# Patient Record
Sex: Female | Born: 1940
Health system: Southern US, Community
[De-identification: ages and names within clinical notes are randomized; demographics above are authoritative.]

## PROBLEM LIST (undated history)

## (undated) DIAGNOSIS — I10 Essential (primary) hypertension: Secondary | ICD-10-CM

## (undated) DIAGNOSIS — E785 Hyperlipidemia, unspecified: Secondary | ICD-10-CM

## (undated) DIAGNOSIS — M8580 Other specified disorders of bone density and structure, unspecified site: Secondary | ICD-10-CM

## (undated) DIAGNOSIS — E119 Type 2 diabetes mellitus without complications: Secondary | ICD-10-CM

## (undated) HISTORY — DX: Other specified disorders of bone density and structure, unspecified site: M85.80

## (undated) HISTORY — DX: Hyperlipidemia, unspecified: E78.5

## (undated) HISTORY — DX: Type 2 diabetes mellitus without complications: E11.9

## (undated) HISTORY — DX: Essential (primary) hypertension: I10

---

## 1998-08-21 ENCOUNTER — Emergency Department (HOSPITAL_COMMUNITY): Admission: EM | Admit: 1998-08-21 | Discharge: 1998-08-21 | Payer: Self-pay | Admitting: Internal Medicine

## 1999-06-15 ENCOUNTER — Encounter: Admission: RE | Admit: 1999-06-15 | Discharge: 1999-06-15 | Payer: Self-pay | Admitting: Family Medicine

## 1999-06-15 ENCOUNTER — Encounter: Payer: Self-pay | Admitting: Family Medicine

## 2000-06-17 ENCOUNTER — Encounter: Admission: RE | Admit: 2000-06-17 | Discharge: 2000-06-17 | Payer: Self-pay | Admitting: Family Medicine

## 2000-06-17 ENCOUNTER — Encounter: Payer: Self-pay | Admitting: Family Medicine

## 2001-06-18 ENCOUNTER — Encounter: Payer: Self-pay | Admitting: Family Medicine

## 2001-06-18 ENCOUNTER — Encounter: Admission: RE | Admit: 2001-06-18 | Discharge: 2001-06-18 | Payer: Self-pay | Admitting: Family Medicine

## 2001-10-15 ENCOUNTER — Ambulatory Visit (HOSPITAL_COMMUNITY): Admission: RE | Admit: 2001-10-15 | Discharge: 2001-10-15 | Payer: Self-pay | Admitting: Gastroenterology

## 2002-06-22 ENCOUNTER — Encounter: Payer: Self-pay | Admitting: Family Medicine

## 2002-06-22 ENCOUNTER — Encounter: Admission: RE | Admit: 2002-06-22 | Discharge: 2002-06-22 | Payer: Self-pay | Admitting: Family Medicine

## 2002-08-04 ENCOUNTER — Encounter: Admission: RE | Admit: 2002-08-04 | Discharge: 2002-11-02 | Payer: Self-pay | Admitting: Family Medicine

## 2003-06-23 ENCOUNTER — Encounter: Admission: RE | Admit: 2003-06-23 | Discharge: 2003-06-23 | Payer: Self-pay | Admitting: Family Medicine

## 2003-07-18 ENCOUNTER — Other Ambulatory Visit: Admission: RE | Admit: 2003-07-18 | Discharge: 2003-07-18 | Payer: Self-pay | Admitting: *Deleted

## 2004-07-02 ENCOUNTER — Encounter: Admission: RE | Admit: 2004-07-02 | Discharge: 2004-07-02 | Payer: Self-pay | Admitting: Family Medicine

## 2005-07-04 ENCOUNTER — Encounter: Admission: RE | Admit: 2005-07-04 | Discharge: 2005-07-04 | Payer: Self-pay | Admitting: Family Medicine

## 2005-07-10 ENCOUNTER — Encounter: Admission: RE | Admit: 2005-07-10 | Discharge: 2005-07-10 | Payer: Self-pay | Admitting: Family Medicine

## 2006-07-17 ENCOUNTER — Encounter: Admission: RE | Admit: 2006-07-17 | Discharge: 2006-07-17 | Payer: Self-pay | Admitting: Family Medicine

## 2007-07-20 ENCOUNTER — Encounter: Admission: RE | Admit: 2007-07-20 | Discharge: 2007-07-20 | Payer: Self-pay | Admitting: Family Medicine

## 2008-02-05 ENCOUNTER — Emergency Department (HOSPITAL_COMMUNITY): Admission: EM | Admit: 2008-02-05 | Discharge: 2008-02-06 | Payer: Self-pay | Admitting: Emergency Medicine

## 2008-03-28 ENCOUNTER — Encounter: Admission: RE | Admit: 2008-03-28 | Discharge: 2008-04-14 | Payer: Self-pay | Admitting: Family Medicine

## 2008-04-18 ENCOUNTER — Encounter: Admission: RE | Admit: 2008-04-18 | Discharge: 2008-05-10 | Payer: Self-pay | Admitting: Family Medicine

## 2008-07-21 ENCOUNTER — Encounter: Admission: RE | Admit: 2008-07-21 | Discharge: 2008-07-21 | Payer: Self-pay | Admitting: Family Medicine

## 2009-07-24 ENCOUNTER — Encounter: Admission: RE | Admit: 2009-07-24 | Discharge: 2009-07-24 | Payer: Self-pay | Admitting: Family Medicine

## 2010-05-06 ENCOUNTER — Encounter: Payer: Self-pay | Admitting: Family Medicine

## 2010-06-18 ENCOUNTER — Other Ambulatory Visit: Payer: Self-pay | Admitting: Family Medicine

## 2010-06-18 DIAGNOSIS — Z1231 Encounter for screening mammogram for malignant neoplasm of breast: Secondary | ICD-10-CM

## 2010-07-26 ENCOUNTER — Ambulatory Visit
Admission: RE | Admit: 2010-07-26 | Discharge: 2010-07-26 | Disposition: A | Discharge status: Home / Self Care | Payer: Medicare Other | Source: Ambulatory Visit | Attending: Family Medicine | Admitting: Family Medicine

## 2010-07-26 DIAGNOSIS — Z1231 Encounter for screening mammogram for malignant neoplasm of breast: Secondary | ICD-10-CM

## 2011-05-14 DIAGNOSIS — E782 Mixed hyperlipidemia: Secondary | ICD-10-CM | POA: Diagnosis not present

## 2011-05-14 DIAGNOSIS — IMO0001 Reserved for inherently not codable concepts without codable children: Secondary | ICD-10-CM | POA: Diagnosis not present

## 2011-05-14 DIAGNOSIS — M899 Disorder of bone, unspecified: Secondary | ICD-10-CM | POA: Diagnosis not present

## 2011-05-14 DIAGNOSIS — I10 Essential (primary) hypertension: Secondary | ICD-10-CM | POA: Diagnosis not present

## 2011-05-28 DIAGNOSIS — J029 Acute pharyngitis, unspecified: Secondary | ICD-10-CM | POA: Diagnosis not present

## 2011-06-21 ENCOUNTER — Other Ambulatory Visit: Payer: Self-pay | Admitting: Family Medicine

## 2011-06-21 DIAGNOSIS — Z1231 Encounter for screening mammogram for malignant neoplasm of breast: Secondary | ICD-10-CM

## 2011-07-29 ENCOUNTER — Ambulatory Visit
Admission: RE | Admit: 2011-07-29 | Discharge: 2011-07-29 | Disposition: A | Discharge status: Home / Self Care | Payer: Medicare Other | Source: Ambulatory Visit | Attending: Family Medicine | Admitting: Family Medicine

## 2011-07-29 DIAGNOSIS — Z1231 Encounter for screening mammogram for malignant neoplasm of breast: Secondary | ICD-10-CM | POA: Diagnosis not present

## 2011-08-01 DIAGNOSIS — E119 Type 2 diabetes mellitus without complications: Secondary | ICD-10-CM | POA: Diagnosis not present

## 2011-08-01 DIAGNOSIS — H26019 Infantile and juvenile cortical, lamellar, or zonular cataract, unspecified eye: Secondary | ICD-10-CM | POA: Diagnosis not present

## 2011-08-01 DIAGNOSIS — H2510 Age-related nuclear cataract, unspecified eye: Secondary | ICD-10-CM | POA: Diagnosis not present

## 2011-08-07 DIAGNOSIS — M899 Disorder of bone, unspecified: Secondary | ICD-10-CM | POA: Diagnosis not present

## 2011-08-07 DIAGNOSIS — M949 Disorder of cartilage, unspecified: Secondary | ICD-10-CM | POA: Diagnosis not present

## 2011-08-07 DIAGNOSIS — Z0000 Encounter for general adult medical examination without abnormal findings: Secondary | ICD-10-CM | POA: Diagnosis not present

## 2011-08-07 DIAGNOSIS — M839 Adult osteomalacia, unspecified: Secondary | ICD-10-CM | POA: Diagnosis not present

## 2011-08-07 DIAGNOSIS — E782 Mixed hyperlipidemia: Secondary | ICD-10-CM | POA: Diagnosis not present

## 2011-08-07 DIAGNOSIS — I10 Essential (primary) hypertension: Secondary | ICD-10-CM | POA: Diagnosis not present

## 2011-08-07 DIAGNOSIS — IMO0001 Reserved for inherently not codable concepts without codable children: Secondary | ICD-10-CM | POA: Diagnosis not present

## 2011-08-07 DIAGNOSIS — J029 Acute pharyngitis, unspecified: Secondary | ICD-10-CM | POA: Diagnosis not present

## 2011-08-13 DIAGNOSIS — B353 Tinea pedis: Secondary | ICD-10-CM | POA: Diagnosis not present

## 2011-08-13 DIAGNOSIS — H6120 Impacted cerumen, unspecified ear: Secondary | ICD-10-CM | POA: Diagnosis not present

## 2011-08-13 DIAGNOSIS — E782 Mixed hyperlipidemia: Secondary | ICD-10-CM | POA: Diagnosis not present

## 2011-08-13 DIAGNOSIS — F411 Generalized anxiety disorder: Secondary | ICD-10-CM | POA: Diagnosis not present

## 2011-08-13 DIAGNOSIS — E559 Vitamin D deficiency, unspecified: Secondary | ICD-10-CM | POA: Diagnosis not present

## 2011-08-13 DIAGNOSIS — E119 Type 2 diabetes mellitus without complications: Secondary | ICD-10-CM | POA: Diagnosis not present

## 2011-08-13 DIAGNOSIS — I10 Essential (primary) hypertension: Secondary | ICD-10-CM | POA: Diagnosis not present

## 2011-08-27 DIAGNOSIS — T148 Other injury of unspecified body region: Secondary | ICD-10-CM | POA: Diagnosis not present

## 2011-08-27 DIAGNOSIS — T07XXXA Unspecified multiple injuries, initial encounter: Secondary | ICD-10-CM | POA: Diagnosis not present

## 2011-12-10 DIAGNOSIS — M545 Low back pain, unspecified: Secondary | ICD-10-CM | POA: Diagnosis not present

## 2012-02-10 DIAGNOSIS — E119 Type 2 diabetes mellitus without complications: Secondary | ICD-10-CM | POA: Diagnosis not present

## 2012-02-10 DIAGNOSIS — E559 Vitamin D deficiency, unspecified: Secondary | ICD-10-CM | POA: Diagnosis not present

## 2012-02-10 DIAGNOSIS — M839 Adult osteomalacia, unspecified: Secondary | ICD-10-CM | POA: Diagnosis not present

## 2012-02-10 DIAGNOSIS — I10 Essential (primary) hypertension: Secondary | ICD-10-CM | POA: Diagnosis not present

## 2012-02-10 DIAGNOSIS — R609 Edema, unspecified: Secondary | ICD-10-CM | POA: Diagnosis not present

## 2012-02-10 DIAGNOSIS — M899 Disorder of bone, unspecified: Secondary | ICD-10-CM | POA: Diagnosis not present

## 2012-02-10 DIAGNOSIS — E782 Mixed hyperlipidemia: Secondary | ICD-10-CM | POA: Diagnosis not present

## 2012-02-10 DIAGNOSIS — M545 Low back pain, unspecified: Secondary | ICD-10-CM | POA: Diagnosis not present

## 2012-02-10 DIAGNOSIS — M949 Disorder of cartilage, unspecified: Secondary | ICD-10-CM | POA: Diagnosis not present

## 2012-02-12 DIAGNOSIS — Z23 Encounter for immunization: Secondary | ICD-10-CM | POA: Diagnosis not present

## 2012-02-12 DIAGNOSIS — M545 Low back pain, unspecified: Secondary | ICD-10-CM | POA: Diagnosis not present

## 2012-02-12 DIAGNOSIS — R609 Edema, unspecified: Secondary | ICD-10-CM | POA: Diagnosis not present

## 2012-02-12 DIAGNOSIS — J309 Allergic rhinitis, unspecified: Secondary | ICD-10-CM | POA: Diagnosis not present

## 2012-02-12 DIAGNOSIS — I10 Essential (primary) hypertension: Secondary | ICD-10-CM | POA: Diagnosis not present

## 2012-02-12 DIAGNOSIS — IMO0001 Reserved for inherently not codable concepts without codable children: Secondary | ICD-10-CM | POA: Diagnosis not present

## 2012-02-12 DIAGNOSIS — F411 Generalized anxiety disorder: Secondary | ICD-10-CM | POA: Diagnosis not present

## 2012-02-12 DIAGNOSIS — E782 Mixed hyperlipidemia: Secondary | ICD-10-CM | POA: Diagnosis not present

## 2012-02-12 DIAGNOSIS — G479 Sleep disorder, unspecified: Secondary | ICD-10-CM | POA: Diagnosis not present

## 2012-06-30 ENCOUNTER — Other Ambulatory Visit: Payer: Self-pay

## 2012-06-30 DIAGNOSIS — Z1231 Encounter for screening mammogram for malignant neoplasm of breast: Secondary | ICD-10-CM

## 2012-07-16 DIAGNOSIS — H2510 Age-related nuclear cataract, unspecified eye: Secondary | ICD-10-CM | POA: Diagnosis not present

## 2012-07-16 DIAGNOSIS — E119 Type 2 diabetes mellitus without complications: Secondary | ICD-10-CM | POA: Diagnosis not present

## 2012-07-30 ENCOUNTER — Ambulatory Visit
Admission: RE | Admit: 2012-07-30 | Discharge: 2012-07-30 | Disposition: A | Discharge status: Home / Self Care | Payer: Medicare Other | Source: Ambulatory Visit

## 2012-07-30 DIAGNOSIS — Z1231 Encounter for screening mammogram for malignant neoplasm of breast: Secondary | ICD-10-CM | POA: Diagnosis not present

## 2012-08-10 DIAGNOSIS — M839 Adult osteomalacia, unspecified: Secondary | ICD-10-CM | POA: Diagnosis not present

## 2012-08-10 DIAGNOSIS — E559 Vitamin D deficiency, unspecified: Secondary | ICD-10-CM | POA: Diagnosis not present

## 2012-08-10 DIAGNOSIS — M545 Low back pain, unspecified: Secondary | ICD-10-CM | POA: Diagnosis not present

## 2012-08-10 DIAGNOSIS — E119 Type 2 diabetes mellitus without complications: Secondary | ICD-10-CM | POA: Diagnosis not present

## 2012-08-10 DIAGNOSIS — I10 Essential (primary) hypertension: Secondary | ICD-10-CM | POA: Diagnosis not present

## 2012-08-10 DIAGNOSIS — IMO0001 Reserved for inherently not codable concepts without codable children: Secondary | ICD-10-CM | POA: Diagnosis not present

## 2012-08-10 DIAGNOSIS — E782 Mixed hyperlipidemia: Secondary | ICD-10-CM | POA: Diagnosis not present

## 2012-08-10 DIAGNOSIS — F411 Generalized anxiety disorder: Secondary | ICD-10-CM | POA: Diagnosis not present

## 2012-08-13 DIAGNOSIS — E119 Type 2 diabetes mellitus without complications: Secondary | ICD-10-CM | POA: Diagnosis not present

## 2012-08-13 DIAGNOSIS — J309 Allergic rhinitis, unspecified: Secondary | ICD-10-CM | POA: Diagnosis not present

## 2012-08-13 DIAGNOSIS — M899 Disorder of bone, unspecified: Secondary | ICD-10-CM | POA: Diagnosis not present

## 2012-08-13 DIAGNOSIS — F411 Generalized anxiety disorder: Secondary | ICD-10-CM | POA: Diagnosis not present

## 2012-08-13 DIAGNOSIS — E782 Mixed hyperlipidemia: Secondary | ICD-10-CM | POA: Diagnosis not present

## 2012-08-13 DIAGNOSIS — I10 Essential (primary) hypertension: Secondary | ICD-10-CM | POA: Diagnosis not present

## 2012-11-29 DIAGNOSIS — R0989 Other specified symptoms and signs involving the circulatory and respiratory systems: Secondary | ICD-10-CM | POA: Diagnosis not present

## 2012-11-29 DIAGNOSIS — M79609 Pain in unspecified limb: Secondary | ICD-10-CM | POA: Diagnosis not present

## 2012-11-29 DIAGNOSIS — S8010XA Contusion of unspecified lower leg, initial encounter: Secondary | ICD-10-CM | POA: Diagnosis not present

## 2013-01-30 DIAGNOSIS — Z23 Encounter for immunization: Secondary | ICD-10-CM | POA: Diagnosis not present

## 2013-03-03 DIAGNOSIS — I10 Essential (primary) hypertension: Secondary | ICD-10-CM | POA: Diagnosis not present

## 2013-03-03 DIAGNOSIS — E119 Type 2 diabetes mellitus without complications: Secondary | ICD-10-CM | POA: Diagnosis not present

## 2013-03-03 DIAGNOSIS — F411 Generalized anxiety disorder: Secondary | ICD-10-CM | POA: Diagnosis not present

## 2013-03-03 DIAGNOSIS — J309 Allergic rhinitis, unspecified: Secondary | ICD-10-CM | POA: Diagnosis not present

## 2013-03-03 DIAGNOSIS — E782 Mixed hyperlipidemia: Secondary | ICD-10-CM | POA: Diagnosis not present

## 2013-03-03 DIAGNOSIS — M899 Disorder of bone, unspecified: Secondary | ICD-10-CM | POA: Diagnosis not present

## 2013-03-05 DIAGNOSIS — Z01419 Encounter for gynecological examination (general) (routine) without abnormal findings: Secondary | ICD-10-CM | POA: Diagnosis not present

## 2013-03-05 DIAGNOSIS — E119 Type 2 diabetes mellitus without complications: Secondary | ICD-10-CM | POA: Diagnosis not present

## 2013-03-05 DIAGNOSIS — Z1331 Encounter for screening for depression: Secondary | ICD-10-CM | POA: Diagnosis not present

## 2013-03-05 DIAGNOSIS — I10 Essential (primary) hypertension: Secondary | ICD-10-CM | POA: Diagnosis not present

## 2013-03-05 DIAGNOSIS — Z0000 Encounter for general adult medical examination without abnormal findings: Secondary | ICD-10-CM | POA: Diagnosis not present

## 2013-03-05 DIAGNOSIS — Z800 Family history of malignant neoplasm of digestive organs: Secondary | ICD-10-CM | POA: Diagnosis not present

## 2013-03-05 DIAGNOSIS — F411 Generalized anxiety disorder: Secondary | ICD-10-CM | POA: Diagnosis not present

## 2013-03-05 DIAGNOSIS — M899 Disorder of bone, unspecified: Secondary | ICD-10-CM | POA: Diagnosis not present

## 2013-03-05 DIAGNOSIS — E782 Mixed hyperlipidemia: Secondary | ICD-10-CM | POA: Diagnosis not present

## 2013-03-31 DIAGNOSIS — K5730 Diverticulosis of large intestine without perforation or abscess without bleeding: Secondary | ICD-10-CM | POA: Diagnosis not present

## 2013-03-31 DIAGNOSIS — Z800 Family history of malignant neoplasm of digestive organs: Secondary | ICD-10-CM | POA: Diagnosis not present

## 2013-04-19 DIAGNOSIS — Z800 Family history of malignant neoplasm of digestive organs: Secondary | ICD-10-CM | POA: Diagnosis not present

## 2013-04-19 DIAGNOSIS — K5730 Diverticulosis of large intestine without perforation or abscess without bleeding: Secondary | ICD-10-CM | POA: Diagnosis not present

## 2013-04-19 DIAGNOSIS — D126 Benign neoplasm of colon, unspecified: Secondary | ICD-10-CM | POA: Diagnosis not present

## 2013-04-19 DIAGNOSIS — Z1211 Encounter for screening for malignant neoplasm of colon: Secondary | ICD-10-CM | POA: Diagnosis not present

## 2013-07-20 ENCOUNTER — Other Ambulatory Visit: Payer: Self-pay

## 2013-07-20 DIAGNOSIS — Z1231 Encounter for screening mammogram for malignant neoplasm of breast: Secondary | ICD-10-CM

## 2013-08-05 DIAGNOSIS — H2510 Age-related nuclear cataract, unspecified eye: Secondary | ICD-10-CM | POA: Diagnosis not present

## 2013-08-05 DIAGNOSIS — E119 Type 2 diabetes mellitus without complications: Secondary | ICD-10-CM | POA: Diagnosis not present

## 2013-08-10 ENCOUNTER — Ambulatory Visit
Admission: RE | Admit: 2013-08-10 | Discharge: 2013-08-10 | Disposition: A | Discharge status: Home / Self Care | Payer: Medicare Other | Source: Ambulatory Visit

## 2013-08-10 ENCOUNTER — Encounter (INDEPENDENT_AMBULATORY_CARE_PROVIDER_SITE_OTHER): Payer: Self-pay

## 2013-08-10 DIAGNOSIS — Z1231 Encounter for screening mammogram for malignant neoplasm of breast: Secondary | ICD-10-CM

## 2013-08-30 DIAGNOSIS — M949 Disorder of cartilage, unspecified: Secondary | ICD-10-CM | POA: Diagnosis not present

## 2013-08-30 DIAGNOSIS — F411 Generalized anxiety disorder: Secondary | ICD-10-CM | POA: Diagnosis not present

## 2013-08-30 DIAGNOSIS — E119 Type 2 diabetes mellitus without complications: Secondary | ICD-10-CM | POA: Diagnosis not present

## 2013-08-30 DIAGNOSIS — J309 Allergic rhinitis, unspecified: Secondary | ICD-10-CM | POA: Diagnosis not present

## 2013-08-30 DIAGNOSIS — I10 Essential (primary) hypertension: Secondary | ICD-10-CM | POA: Diagnosis not present

## 2013-08-30 DIAGNOSIS — E782 Mixed hyperlipidemia: Secondary | ICD-10-CM | POA: Diagnosis not present

## 2013-08-30 DIAGNOSIS — M899 Disorder of bone, unspecified: Secondary | ICD-10-CM | POA: Diagnosis not present

## 2013-09-03 DIAGNOSIS — M949 Disorder of cartilage, unspecified: Secondary | ICD-10-CM | POA: Diagnosis not present

## 2013-09-03 DIAGNOSIS — F411 Generalized anxiety disorder: Secondary | ICD-10-CM | POA: Diagnosis not present

## 2013-09-03 DIAGNOSIS — E782 Mixed hyperlipidemia: Secondary | ICD-10-CM | POA: Diagnosis not present

## 2013-09-03 DIAGNOSIS — R197 Diarrhea, unspecified: Secondary | ICD-10-CM | POA: Diagnosis not present

## 2013-09-03 DIAGNOSIS — I10 Essential (primary) hypertension: Secondary | ICD-10-CM | POA: Diagnosis not present

## 2013-09-03 DIAGNOSIS — IMO0001 Reserved for inherently not codable concepts without codable children: Secondary | ICD-10-CM | POA: Diagnosis not present

## 2013-09-03 DIAGNOSIS — Z23 Encounter for immunization: Secondary | ICD-10-CM | POA: Diagnosis not present

## 2013-09-03 DIAGNOSIS — M899 Disorder of bone, unspecified: Secondary | ICD-10-CM | POA: Diagnosis not present

## 2013-10-25 DIAGNOSIS — M79609 Pain in unspecified limb: Secondary | ICD-10-CM | POA: Diagnosis not present

## 2013-10-25 DIAGNOSIS — J329 Chronic sinusitis, unspecified: Secondary | ICD-10-CM | POA: Diagnosis not present

## 2013-10-27 DIAGNOSIS — L739 Follicular disorder, unspecified: Secondary | ICD-10-CM | POA: Diagnosis not present

## 2013-10-27 DIAGNOSIS — L570 Actinic keratosis: Secondary | ICD-10-CM | POA: Diagnosis not present

## 2013-10-27 DIAGNOSIS — K2100 Gastro-esophageal reflux disease with esophagitis, without bleeding: Secondary | ICD-10-CM | POA: Diagnosis not present

## 2013-10-27 DIAGNOSIS — R0789 Other chest pain: Secondary | ICD-10-CM | POA: Diagnosis not present

## 2013-12-03 DIAGNOSIS — Z23 Encounter for immunization: Secondary | ICD-10-CM | POA: Diagnosis not present

## 2013-12-03 DIAGNOSIS — R197 Diarrhea, unspecified: Secondary | ICD-10-CM | POA: Diagnosis not present

## 2013-12-03 DIAGNOSIS — M949 Disorder of cartilage, unspecified: Secondary | ICD-10-CM | POA: Diagnosis not present

## 2013-12-03 DIAGNOSIS — I10 Essential (primary) hypertension: Secondary | ICD-10-CM | POA: Diagnosis not present

## 2013-12-03 DIAGNOSIS — IMO0001 Reserved for inherently not codable concepts without codable children: Secondary | ICD-10-CM | POA: Diagnosis not present

## 2013-12-03 DIAGNOSIS — F411 Generalized anxiety disorder: Secondary | ICD-10-CM | POA: Diagnosis not present

## 2013-12-03 DIAGNOSIS — M899 Disorder of bone, unspecified: Secondary | ICD-10-CM | POA: Diagnosis not present

## 2013-12-03 DIAGNOSIS — E782 Mixed hyperlipidemia: Secondary | ICD-10-CM | POA: Diagnosis not present

## 2014-01-26 ENCOUNTER — Encounter: Payer: Medicare Other | Attending: Family Medicine | Admitting: *Deleted

## 2014-01-26 VITALS — Ht 63.0 in | Wt 170.3 lb

## 2014-01-26 DIAGNOSIS — E118 Type 2 diabetes mellitus with unspecified complications: Secondary | ICD-10-CM | POA: Diagnosis not present

## 2014-01-26 DIAGNOSIS — Z23 Encounter for immunization: Secondary | ICD-10-CM | POA: Diagnosis not present

## 2014-01-26 DIAGNOSIS — Z713 Dietary counseling and surveillance: Secondary | ICD-10-CM | POA: Insufficient documentation

## 2014-01-26 DIAGNOSIS — E1165 Type 2 diabetes mellitus with hyperglycemia: Secondary | ICD-10-CM

## 2014-01-26 DIAGNOSIS — IMO0002 Reserved for concepts with insufficient information to code with codable children: Secondary | ICD-10-CM

## 2014-01-26 NOTE — Patient Instructions (Signed)
Plan:  Aim for 2 Carb Choices per meal (30 grams) +/- 1 either way  Aim for 0-1 Carbs per snack if hungry  Include protein in moderation with your meals and snacks Consider reading food labels for Total Carbohydrate of foods Continue with your activity level daily as tolerated Continue checking BG before breakfast every day and once a week check 2 hours after supper to evaluate your BG patterns

## 2014-01-30 ENCOUNTER — Encounter: Payer: Self-pay | Admitting: *Deleted

## 2014-01-30 NOTE — Progress Notes (Signed)
Diabetes Self-Management Education  Visit Type:  Initial  Appt. Start Time: 1030 Appt. End Time: 1200  01/30/2014  Allison Eaton, identified by name and date of birth, is a 73 y.o. female with a diagnosis of Diabetes: Type 2.  Other people present during visit:  Patient   ASSESSMENT  Height 5\' 3"  (1.6 m), weight 170 lb 4.8 oz (77.248 kg). Body mass index is 30.18 kg/(m^2).  Initial Visit Information:  Are you currently following a meal plan?: No   Are you taking your medications as prescribed?: Yes Are you checking your feet?: Yes How many days per week are you checking your feet?: 7 How often do you need to have someone help you when you read instructions, pamphlets, or other written materials from your doctor or pharmacy?: 1 - Never    Psychosocial:     Patient Belief/Attitude about Diabetes: Motivated to manage diabetes Self-care barriers: None Self-management support: Doctor's office;CDE visits Other persons present: Patient Patient Concerns: Nutrition/Meal planning Preferred Learning Style: Visual Learning Readiness: Contemplating  Complications:   Last HgB A1C per patient/outside source: 8.5 mg/dL How often do you check your blood sugar?: 1-2 times/day (FBG daily) Fasting Blood glucose range (mg/dL): 70-129 Number of hypoglycemic episodes per month: 1 Can you tell when your blood sugar is low?: Yes What do you do if your blood sugar is low?: candy or crackers Have you had a dilated eye exam in the past 12 months?: Yes Have you had a dental exam in the past 12 months?: Yes  Diet Intake:  Breakfast: toast, egg, sausage OR same meats in a biscuit, water Snack (morning): no Lunch: left overs Snack (afternoon): no Dinner: meat, starch, vegetables, salad, water Snack (evening): not usually, maybe chese crackers Beverage(s): water  Exercise:  Exercise: Light (walking / raking leaves) (walks to mail box 1 mile away or to son's house routinely) Light  Exercise amount of time (min / week): 120  Individualized Plan for Diabetes Self-Management Training:   Learning Objective:  Patient will have a greater understanding of diabetes self-management.  Patient education plan per assessed needs and concerns is to attend individual sessions for     Education Topics Reviewed with Patient Today:  Definition of diabetes, type 1 and 2, and the diagnosis of diabetes Role of diet in the treatment of diabetes and the relationship between the three main macronutrients and blood glucose level;Food label reading, portion sizes and measuring food.;Carbohydrate counting Role of exercise on diabetes management, blood pressure control and cardiac health. Reviewed patients medication for diabetes, action, purpose, timing of dose and side effects. Identified appropriate SMBG and/or A1C goals. Taught treatment of hypoglycemia - the 15 rule. Lipid levels, blood glucose control and heart disease Role of stress on diabetes      PATIENTS GOALS/Plan (Developed by the patient):  Nutrition: Follow meal plan discussed Physical Activity: Exercise 3-5 times per week Medications: take my medication as prescribed Monitoring : test blood glucose pre and post meals as discussed  Plan:   Patient Instructions  Plan:  Aim for 2 Carb Choices per meal (30 grams) +/- 1 either way  Aim for 0-1 Carbs per snack if hungry  Include protein in moderation with your meals and snacks Consider reading food labels for Total Carbohydrate of foods Continue with your activity level daily as tolerated Continue checking BG before breakfast every day and once a week check 2 hours after supper to evaluate your BG patterns       Expected Outcomes:  Demonstrated interest in learning. Expect positive outcomes  Education material provided: Living Well with Diabetes, A1C conversion sheet, Meal plan card and Carbohydrate counting sheet  If problems or questions, patient to contact team  via:  Phone and Email  Future DSME appointment: PRN

## 2014-03-07 DIAGNOSIS — E1165 Type 2 diabetes mellitus with hyperglycemia: Secondary | ICD-10-CM | POA: Diagnosis not present

## 2014-04-22 DIAGNOSIS — Z800 Family history of malignant neoplasm of digestive organs: Secondary | ICD-10-CM | POA: Diagnosis not present

## 2014-04-22 DIAGNOSIS — Z0000 Encounter for general adult medical examination without abnormal findings: Secondary | ICD-10-CM | POA: Diagnosis not present

## 2014-04-22 DIAGNOSIS — I10 Essential (primary) hypertension: Secondary | ICD-10-CM | POA: Diagnosis not present

## 2014-04-22 DIAGNOSIS — Z1389 Encounter for screening for other disorder: Secondary | ICD-10-CM | POA: Diagnosis not present

## 2014-04-22 DIAGNOSIS — E559 Vitamin D deficiency, unspecified: Secondary | ICD-10-CM | POA: Diagnosis not present

## 2014-04-22 DIAGNOSIS — M8580 Other specified disorders of bone density and structure, unspecified site: Secondary | ICD-10-CM | POA: Diagnosis not present

## 2014-04-22 DIAGNOSIS — M797 Fibromyalgia: Secondary | ICD-10-CM | POA: Diagnosis not present

## 2014-04-22 DIAGNOSIS — K5730 Diverticulosis of large intestine without perforation or abscess without bleeding: Secondary | ICD-10-CM | POA: Diagnosis not present

## 2014-04-22 DIAGNOSIS — F419 Anxiety disorder, unspecified: Secondary | ICD-10-CM | POA: Diagnosis not present

## 2014-04-22 DIAGNOSIS — E119 Type 2 diabetes mellitus without complications: Secondary | ICD-10-CM | POA: Diagnosis not present

## 2014-04-22 DIAGNOSIS — E782 Mixed hyperlipidemia: Secondary | ICD-10-CM | POA: Diagnosis not present

## 2014-05-18 DIAGNOSIS — M899 Disorder of bone, unspecified: Secondary | ICD-10-CM | POA: Diagnosis not present

## 2014-05-18 DIAGNOSIS — M8580 Other specified disorders of bone density and structure, unspecified site: Secondary | ICD-10-CM | POA: Diagnosis not present

## 2014-06-03 DIAGNOSIS — B349 Viral infection, unspecified: Secondary | ICD-10-CM | POA: Diagnosis not present

## 2014-06-03 DIAGNOSIS — R61 Generalized hyperhidrosis: Secondary | ICD-10-CM | POA: Diagnosis not present

## 2014-06-30 ENCOUNTER — Other Ambulatory Visit: Payer: Self-pay

## 2014-06-30 DIAGNOSIS — Z1231 Encounter for screening mammogram for malignant neoplasm of breast: Secondary | ICD-10-CM

## 2014-08-08 DIAGNOSIS — E119 Type 2 diabetes mellitus without complications: Secondary | ICD-10-CM | POA: Diagnosis not present

## 2014-08-08 DIAGNOSIS — H2513 Age-related nuclear cataract, bilateral: Secondary | ICD-10-CM | POA: Diagnosis not present

## 2014-08-12 ENCOUNTER — Ambulatory Visit
Admission: RE | Admit: 2014-08-12 | Discharge: 2014-08-12 | Disposition: A | Discharge status: Home / Self Care | Payer: Medicare Other | Source: Ambulatory Visit

## 2014-08-12 DIAGNOSIS — Z1231 Encounter for screening mammogram for malignant neoplasm of breast: Secondary | ICD-10-CM

## 2014-08-15 DIAGNOSIS — E782 Mixed hyperlipidemia: Secondary | ICD-10-CM | POA: Diagnosis not present

## 2014-08-15 DIAGNOSIS — E119 Type 2 diabetes mellitus without complications: Secondary | ICD-10-CM | POA: Diagnosis not present

## 2014-08-15 DIAGNOSIS — I10 Essential (primary) hypertension: Secondary | ICD-10-CM | POA: Diagnosis not present

## 2014-08-15 DIAGNOSIS — E559 Vitamin D deficiency, unspecified: Secondary | ICD-10-CM | POA: Diagnosis not present

## 2014-08-18 DIAGNOSIS — J309 Allergic rhinitis, unspecified: Secondary | ICD-10-CM | POA: Diagnosis not present

## 2014-08-18 DIAGNOSIS — E782 Mixed hyperlipidemia: Secondary | ICD-10-CM | POA: Diagnosis not present

## 2014-08-18 DIAGNOSIS — M8580 Other specified disorders of bone density and structure, unspecified site: Secondary | ICD-10-CM | POA: Diagnosis not present

## 2014-08-18 DIAGNOSIS — I10 Essential (primary) hypertension: Secondary | ICD-10-CM | POA: Diagnosis not present

## 2014-08-18 DIAGNOSIS — F419 Anxiety disorder, unspecified: Secondary | ICD-10-CM | POA: Diagnosis not present

## 2014-08-18 DIAGNOSIS — E559 Vitamin D deficiency, unspecified: Secondary | ICD-10-CM | POA: Diagnosis not present

## 2014-08-18 DIAGNOSIS — E119 Type 2 diabetes mellitus without complications: Secondary | ICD-10-CM | POA: Diagnosis not present

## 2014-11-12 DIAGNOSIS — K219 Gastro-esophageal reflux disease without esophagitis: Secondary | ICD-10-CM | POA: Diagnosis not present

## 2014-11-12 DIAGNOSIS — R079 Chest pain, unspecified: Secondary | ICD-10-CM | POA: Diagnosis not present

## 2015-02-21 DIAGNOSIS — E559 Vitamin D deficiency, unspecified: Secondary | ICD-10-CM | POA: Diagnosis not present

## 2015-02-21 DIAGNOSIS — E782 Mixed hyperlipidemia: Secondary | ICD-10-CM | POA: Diagnosis not present

## 2015-02-21 DIAGNOSIS — I10 Essential (primary) hypertension: Secondary | ICD-10-CM | POA: Diagnosis not present

## 2015-02-21 DIAGNOSIS — F419 Anxiety disorder, unspecified: Secondary | ICD-10-CM | POA: Diagnosis not present

## 2015-02-21 DIAGNOSIS — J309 Allergic rhinitis, unspecified: Secondary | ICD-10-CM | POA: Diagnosis not present

## 2015-02-21 DIAGNOSIS — M8580 Other specified disorders of bone density and structure, unspecified site: Secondary | ICD-10-CM | POA: Diagnosis not present

## 2015-02-21 DIAGNOSIS — E1165 Type 2 diabetes mellitus with hyperglycemia: Secondary | ICD-10-CM | POA: Diagnosis not present

## 2015-02-21 DIAGNOSIS — E119 Type 2 diabetes mellitus without complications: Secondary | ICD-10-CM | POA: Diagnosis not present

## 2015-02-23 DIAGNOSIS — I10 Essential (primary) hypertension: Secondary | ICD-10-CM | POA: Diagnosis not present

## 2015-02-23 DIAGNOSIS — E119 Type 2 diabetes mellitus without complications: Secondary | ICD-10-CM | POA: Diagnosis not present

## 2015-02-23 DIAGNOSIS — F419 Anxiety disorder, unspecified: Secondary | ICD-10-CM | POA: Diagnosis not present

## 2015-02-23 DIAGNOSIS — Z23 Encounter for immunization: Secondary | ICD-10-CM | POA: Diagnosis not present

## 2015-02-23 DIAGNOSIS — E782 Mixed hyperlipidemia: Secondary | ICD-10-CM | POA: Diagnosis not present

## 2015-04-01 DIAGNOSIS — M5489 Other dorsalgia: Secondary | ICD-10-CM | POA: Diagnosis not present

## 2015-04-01 DIAGNOSIS — J3089 Other allergic rhinitis: Secondary | ICD-10-CM | POA: Diagnosis not present

## 2015-06-15 DIAGNOSIS — J309 Allergic rhinitis, unspecified: Secondary | ICD-10-CM | POA: Diagnosis not present

## 2015-06-15 DIAGNOSIS — K219 Gastro-esophageal reflux disease without esophagitis: Secondary | ICD-10-CM | POA: Diagnosis not present

## 2015-07-03 ENCOUNTER — Other Ambulatory Visit: Payer: Self-pay

## 2015-07-03 DIAGNOSIS — Z1231 Encounter for screening mammogram for malignant neoplasm of breast: Secondary | ICD-10-CM

## 2015-08-16 DIAGNOSIS — E119 Type 2 diabetes mellitus without complications: Secondary | ICD-10-CM | POA: Diagnosis not present

## 2015-08-16 DIAGNOSIS — M549 Dorsalgia, unspecified: Secondary | ICD-10-CM | POA: Diagnosis not present

## 2015-08-16 DIAGNOSIS — F419 Anxiety disorder, unspecified: Secondary | ICD-10-CM | POA: Diagnosis not present

## 2015-08-16 DIAGNOSIS — I10 Essential (primary) hypertension: Secondary | ICD-10-CM | POA: Diagnosis not present

## 2015-08-16 DIAGNOSIS — M8580 Other specified disorders of bone density and structure, unspecified site: Secondary | ICD-10-CM | POA: Diagnosis not present

## 2015-08-16 DIAGNOSIS — J309 Allergic rhinitis, unspecified: Secondary | ICD-10-CM | POA: Diagnosis not present

## 2015-08-16 DIAGNOSIS — E782 Mixed hyperlipidemia: Secondary | ICD-10-CM | POA: Diagnosis not present

## 2015-08-16 DIAGNOSIS — Z7984 Long term (current) use of oral hypoglycemic drugs: Secondary | ICD-10-CM | POA: Diagnosis not present

## 2015-08-16 DIAGNOSIS — E559 Vitamin D deficiency, unspecified: Secondary | ICD-10-CM | POA: Diagnosis not present

## 2015-08-18 ENCOUNTER — Ambulatory Visit
Admission: RE | Admit: 2015-08-18 | Discharge: 2015-08-18 | Disposition: A | Discharge status: Home / Self Care | Payer: Medicare Other | Source: Ambulatory Visit

## 2015-08-18 DIAGNOSIS — Z1231 Encounter for screening mammogram for malignant neoplasm of breast: Secondary | ICD-10-CM

## 2015-08-21 DIAGNOSIS — H2513 Age-related nuclear cataract, bilateral: Secondary | ICD-10-CM | POA: Diagnosis not present

## 2015-08-21 DIAGNOSIS — H25013 Cortical age-related cataract, bilateral: Secondary | ICD-10-CM | POA: Diagnosis not present

## 2015-08-21 DIAGNOSIS — E119 Type 2 diabetes mellitus without complications: Secondary | ICD-10-CM | POA: Diagnosis not present

## 2015-08-23 DIAGNOSIS — Z800 Family history of malignant neoplasm of digestive organs: Secondary | ICD-10-CM | POA: Diagnosis not present

## 2015-08-23 DIAGNOSIS — M8580 Other specified disorders of bone density and structure, unspecified site: Secondary | ICD-10-CM | POA: Diagnosis not present

## 2015-08-23 DIAGNOSIS — I10 Essential (primary) hypertension: Secondary | ICD-10-CM | POA: Diagnosis not present

## 2015-08-23 DIAGNOSIS — Z0000 Encounter for general adult medical examination without abnormal findings: Secondary | ICD-10-CM | POA: Diagnosis not present

## 2015-08-23 DIAGNOSIS — Z1389 Encounter for screening for other disorder: Secondary | ICD-10-CM | POA: Diagnosis not present

## 2015-08-23 DIAGNOSIS — J309 Allergic rhinitis, unspecified: Secondary | ICD-10-CM | POA: Diagnosis not present

## 2015-08-23 DIAGNOSIS — K219 Gastro-esophageal reflux disease without esophagitis: Secondary | ICD-10-CM | POA: Diagnosis not present

## 2015-08-23 DIAGNOSIS — E782 Mixed hyperlipidemia: Secondary | ICD-10-CM | POA: Diagnosis not present

## 2015-08-23 DIAGNOSIS — Z01419 Encounter for gynecological examination (general) (routine) without abnormal findings: Secondary | ICD-10-CM | POA: Diagnosis not present

## 2015-08-23 DIAGNOSIS — E559 Vitamin D deficiency, unspecified: Secondary | ICD-10-CM | POA: Diagnosis not present

## 2015-08-23 DIAGNOSIS — E119 Type 2 diabetes mellitus without complications: Secondary | ICD-10-CM | POA: Diagnosis not present

## 2015-08-23 DIAGNOSIS — F419 Anxiety disorder, unspecified: Secondary | ICD-10-CM | POA: Diagnosis not present

## 2015-12-09 DIAGNOSIS — R079 Chest pain, unspecified: Secondary | ICD-10-CM | POA: Diagnosis not present

## 2015-12-09 DIAGNOSIS — F4323 Adjustment disorder with mixed anxiety and depressed mood: Secondary | ICD-10-CM | POA: Diagnosis not present

## 2015-12-25 DIAGNOSIS — Z7984 Long term (current) use of oral hypoglycemic drugs: Secondary | ICD-10-CM | POA: Diagnosis not present

## 2015-12-25 DIAGNOSIS — E119 Type 2 diabetes mellitus without complications: Secondary | ICD-10-CM | POA: Diagnosis not present

## 2015-12-25 DIAGNOSIS — F419 Anxiety disorder, unspecified: Secondary | ICD-10-CM | POA: Diagnosis not present

## 2015-12-25 DIAGNOSIS — Z23 Encounter for immunization: Secondary | ICD-10-CM | POA: Diagnosis not present

## 2016-04-26 DIAGNOSIS — E119 Type 2 diabetes mellitus without complications: Secondary | ICD-10-CM | POA: Diagnosis not present

## 2016-04-26 DIAGNOSIS — Z7984 Long term (current) use of oral hypoglycemic drugs: Secondary | ICD-10-CM | POA: Diagnosis not present

## 2016-04-26 DIAGNOSIS — F419 Anxiety disorder, unspecified: Secondary | ICD-10-CM | POA: Diagnosis not present

## 2016-04-29 DIAGNOSIS — Z7984 Long term (current) use of oral hypoglycemic drugs: Secondary | ICD-10-CM | POA: Diagnosis not present

## 2016-04-29 DIAGNOSIS — E559 Vitamin D deficiency, unspecified: Secondary | ICD-10-CM | POA: Diagnosis not present

## 2016-04-29 DIAGNOSIS — J309 Allergic rhinitis, unspecified: Secondary | ICD-10-CM | POA: Diagnosis not present

## 2016-04-29 DIAGNOSIS — E119 Type 2 diabetes mellitus without complications: Secondary | ICD-10-CM | POA: Diagnosis not present

## 2016-04-29 DIAGNOSIS — I10 Essential (primary) hypertension: Secondary | ICD-10-CM | POA: Diagnosis not present

## 2016-04-29 DIAGNOSIS — K219 Gastro-esophageal reflux disease without esophagitis: Secondary | ICD-10-CM | POA: Diagnosis not present

## 2016-04-29 DIAGNOSIS — F419 Anxiety disorder, unspecified: Secondary | ICD-10-CM | POA: Diagnosis not present

## 2016-04-29 DIAGNOSIS — M8580 Other specified disorders of bone density and structure, unspecified site: Secondary | ICD-10-CM | POA: Diagnosis not present

## 2016-04-29 DIAGNOSIS — E782 Mixed hyperlipidemia: Secondary | ICD-10-CM | POA: Diagnosis not present

## 2016-06-18 ENCOUNTER — Other Ambulatory Visit: Payer: Self-pay | Admitting: Family Medicine

## 2016-06-18 DIAGNOSIS — Z1231 Encounter for screening mammogram for malignant neoplasm of breast: Secondary | ICD-10-CM

## 2016-07-18 DIAGNOSIS — F418 Other specified anxiety disorders: Secondary | ICD-10-CM | POA: Diagnosis not present

## 2016-08-19 ENCOUNTER — Encounter: Payer: Self-pay | Admitting: Radiology

## 2016-08-19 ENCOUNTER — Ambulatory Visit
Admission: RE | Admit: 2016-08-19 | Discharge: 2016-08-19 | Disposition: A | Discharge status: Home / Self Care | Payer: Medicare Other | Source: Ambulatory Visit | Attending: Family Medicine | Admitting: Family Medicine

## 2016-08-19 DIAGNOSIS — Z1231 Encounter for screening mammogram for malignant neoplasm of breast: Secondary | ICD-10-CM | POA: Diagnosis not present

## 2016-08-30 DIAGNOSIS — E559 Vitamin D deficiency, unspecified: Secondary | ICD-10-CM | POA: Diagnosis not present

## 2016-08-30 DIAGNOSIS — Z7984 Long term (current) use of oral hypoglycemic drugs: Secondary | ICD-10-CM | POA: Diagnosis not present

## 2016-08-30 DIAGNOSIS — E782 Mixed hyperlipidemia: Secondary | ICD-10-CM | POA: Diagnosis not present

## 2016-08-30 DIAGNOSIS — Z0000 Encounter for general adult medical examination without abnormal findings: Secondary | ICD-10-CM | POA: Diagnosis not present

## 2016-08-30 DIAGNOSIS — E119 Type 2 diabetes mellitus without complications: Secondary | ICD-10-CM | POA: Diagnosis not present

## 2016-08-30 DIAGNOSIS — Z800 Family history of malignant neoplasm of digestive organs: Secondary | ICD-10-CM | POA: Diagnosis not present

## 2016-08-30 DIAGNOSIS — M8580 Other specified disorders of bone density and structure, unspecified site: Secondary | ICD-10-CM | POA: Diagnosis not present

## 2016-08-30 DIAGNOSIS — K219 Gastro-esophageal reflux disease without esophagitis: Secondary | ICD-10-CM | POA: Diagnosis not present

## 2016-08-30 DIAGNOSIS — F419 Anxiety disorder, unspecified: Secondary | ICD-10-CM | POA: Diagnosis not present

## 2016-08-30 DIAGNOSIS — Z1389 Encounter for screening for other disorder: Secondary | ICD-10-CM | POA: Diagnosis not present

## 2016-08-30 DIAGNOSIS — I10 Essential (primary) hypertension: Secondary | ICD-10-CM | POA: Diagnosis not present

## 2016-08-30 DIAGNOSIS — J309 Allergic rhinitis, unspecified: Secondary | ICD-10-CM | POA: Diagnosis not present

## 2016-09-03 DIAGNOSIS — K219 Gastro-esophageal reflux disease without esophagitis: Secondary | ICD-10-CM | POA: Diagnosis not present

## 2016-09-03 DIAGNOSIS — E782 Mixed hyperlipidemia: Secondary | ICD-10-CM | POA: Diagnosis not present

## 2016-09-03 DIAGNOSIS — I10 Essential (primary) hypertension: Secondary | ICD-10-CM | POA: Diagnosis not present

## 2016-09-03 DIAGNOSIS — F419 Anxiety disorder, unspecified: Secondary | ICD-10-CM | POA: Diagnosis not present

## 2016-09-03 DIAGNOSIS — E1165 Type 2 diabetes mellitus with hyperglycemia: Secondary | ICD-10-CM | POA: Diagnosis not present

## 2016-09-03 DIAGNOSIS — E559 Vitamin D deficiency, unspecified: Secondary | ICD-10-CM | POA: Diagnosis not present

## 2016-09-03 DIAGNOSIS — M797 Fibromyalgia: Secondary | ICD-10-CM | POA: Diagnosis not present

## 2016-09-03 DIAGNOSIS — Z1389 Encounter for screening for other disorder: Secondary | ICD-10-CM | POA: Diagnosis not present

## 2016-09-03 DIAGNOSIS — Z7984 Long term (current) use of oral hypoglycemic drugs: Secondary | ICD-10-CM | POA: Diagnosis not present

## 2016-09-03 DIAGNOSIS — Z0000 Encounter for general adult medical examination without abnormal findings: Secondary | ICD-10-CM | POA: Diagnosis not present

## 2016-09-03 DIAGNOSIS — M85851 Other specified disorders of bone density and structure, right thigh: Secondary | ICD-10-CM | POA: Diagnosis not present

## 2016-09-03 DIAGNOSIS — J309 Allergic rhinitis, unspecified: Secondary | ICD-10-CM | POA: Diagnosis not present

## 2016-09-06 DIAGNOSIS — S76119A Strain of unspecified quadriceps muscle, fascia and tendon, initial encounter: Secondary | ICD-10-CM | POA: Diagnosis not present

## 2016-10-10 DIAGNOSIS — H2513 Age-related nuclear cataract, bilateral: Secondary | ICD-10-CM | POA: Diagnosis not present

## 2016-10-10 DIAGNOSIS — E119 Type 2 diabetes mellitus without complications: Secondary | ICD-10-CM | POA: Diagnosis not present

## 2016-10-10 DIAGNOSIS — H25013 Cortical age-related cataract, bilateral: Secondary | ICD-10-CM | POA: Diagnosis not present

## 2016-10-24 DIAGNOSIS — M8588 Other specified disorders of bone density and structure, other site: Secondary | ICD-10-CM | POA: Diagnosis not present

## 2016-11-02 DIAGNOSIS — W57XXXA Bitten or stung by nonvenomous insect and other nonvenomous arthropods, initial encounter: Secondary | ICD-10-CM | POA: Diagnosis not present

## 2016-11-02 DIAGNOSIS — R21 Rash and other nonspecific skin eruption: Secondary | ICD-10-CM | POA: Diagnosis not present

## 2017-03-10 DIAGNOSIS — E119 Type 2 diabetes mellitus without complications: Secondary | ICD-10-CM | POA: Diagnosis not present

## 2017-03-10 DIAGNOSIS — E1165 Type 2 diabetes mellitus with hyperglycemia: Secondary | ICD-10-CM | POA: Diagnosis not present

## 2017-03-10 DIAGNOSIS — Z7984 Long term (current) use of oral hypoglycemic drugs: Secondary | ICD-10-CM | POA: Diagnosis not present

## 2017-03-12 DIAGNOSIS — I10 Essential (primary) hypertension: Secondary | ICD-10-CM | POA: Diagnosis not present

## 2017-03-12 DIAGNOSIS — E119 Type 2 diabetes mellitus without complications: Secondary | ICD-10-CM | POA: Diagnosis not present

## 2017-03-12 DIAGNOSIS — Z23 Encounter for immunization: Secondary | ICD-10-CM | POA: Diagnosis not present

## 2017-03-12 DIAGNOSIS — F418 Other specified anxiety disorders: Secondary | ICD-10-CM | POA: Diagnosis not present

## 2017-03-12 DIAGNOSIS — E782 Mixed hyperlipidemia: Secondary | ICD-10-CM | POA: Diagnosis not present

## 2017-03-12 DIAGNOSIS — J309 Allergic rhinitis, unspecified: Secondary | ICD-10-CM | POA: Diagnosis not present

## 2017-03-12 DIAGNOSIS — E559 Vitamin D deficiency, unspecified: Secondary | ICD-10-CM | POA: Diagnosis not present

## 2017-03-12 DIAGNOSIS — K219 Gastro-esophageal reflux disease without esophagitis: Secondary | ICD-10-CM | POA: Diagnosis not present

## 2017-03-12 DIAGNOSIS — M8580 Other specified disorders of bone density and structure, unspecified site: Secondary | ICD-10-CM | POA: Diagnosis not present

## 2017-05-22 DIAGNOSIS — F418 Other specified anxiety disorders: Secondary | ICD-10-CM | POA: Diagnosis not present

## 2017-05-22 DIAGNOSIS — R002 Palpitations: Secondary | ICD-10-CM | POA: Diagnosis not present

## 2017-06-18 DIAGNOSIS — F419 Anxiety disorder, unspecified: Secondary | ICD-10-CM | POA: Diagnosis not present

## 2017-07-15 ENCOUNTER — Other Ambulatory Visit: Payer: Self-pay | Admitting: Family Medicine

## 2017-07-15 DIAGNOSIS — Z1231 Encounter for screening mammogram for malignant neoplasm of breast: Secondary | ICD-10-CM

## 2017-07-22 DIAGNOSIS — W19XXXA Unspecified fall, initial encounter: Secondary | ICD-10-CM | POA: Diagnosis not present

## 2017-07-22 DIAGNOSIS — M25551 Pain in right hip: Secondary | ICD-10-CM | POA: Diagnosis not present

## 2017-08-20 ENCOUNTER — Ambulatory Visit
Admission: RE | Admit: 2017-08-20 | Discharge: 2017-08-20 | Disposition: A | Discharge status: Home / Self Care | Payer: Medicare Other | Source: Ambulatory Visit | Attending: Family Medicine | Admitting: Family Medicine

## 2017-08-20 ENCOUNTER — Ambulatory Visit: Payer: Medicare Other

## 2017-08-20 DIAGNOSIS — Z1231 Encounter for screening mammogram for malignant neoplasm of breast: Secondary | ICD-10-CM

## 2017-08-21 ENCOUNTER — Other Ambulatory Visit: Payer: Self-pay | Admitting: Family Medicine

## 2017-08-21 DIAGNOSIS — R928 Other abnormal and inconclusive findings on diagnostic imaging of breast: Secondary | ICD-10-CM

## 2017-08-22 ENCOUNTER — Ambulatory Visit
Admission: RE | Admit: 2017-08-22 | Discharge: 2017-08-22 | Disposition: A | Discharge status: Home / Self Care | Payer: Medicare Other | Source: Ambulatory Visit | Attending: Family Medicine | Admitting: Family Medicine

## 2017-08-22 DIAGNOSIS — R928 Other abnormal and inconclusive findings on diagnostic imaging of breast: Secondary | ICD-10-CM

## 2017-08-22 DIAGNOSIS — N6489 Other specified disorders of breast: Secondary | ICD-10-CM | POA: Diagnosis not present

## 2017-08-25 ENCOUNTER — Other Ambulatory Visit: Payer: Medicare Other

## 2017-09-01 DIAGNOSIS — M8580 Other specified disorders of bone density and structure, unspecified site: Secondary | ICD-10-CM | POA: Diagnosis not present

## 2017-09-01 DIAGNOSIS — Z23 Encounter for immunization: Secondary | ICD-10-CM | POA: Diagnosis not present

## 2017-09-01 DIAGNOSIS — E119 Type 2 diabetes mellitus without complications: Secondary | ICD-10-CM | POA: Diagnosis not present

## 2017-09-01 DIAGNOSIS — I10 Essential (primary) hypertension: Secondary | ICD-10-CM | POA: Diagnosis not present

## 2017-09-01 DIAGNOSIS — J309 Allergic rhinitis, unspecified: Secondary | ICD-10-CM | POA: Diagnosis not present

## 2017-09-01 DIAGNOSIS — E782 Mixed hyperlipidemia: Secondary | ICD-10-CM | POA: Diagnosis not present

## 2017-09-01 DIAGNOSIS — R002 Palpitations: Secondary | ICD-10-CM | POA: Diagnosis not present

## 2017-09-01 DIAGNOSIS — F418 Other specified anxiety disorders: Secondary | ICD-10-CM | POA: Diagnosis not present

## 2017-09-01 DIAGNOSIS — E559 Vitamin D deficiency, unspecified: Secondary | ICD-10-CM | POA: Diagnosis not present

## 2017-09-01 DIAGNOSIS — K219 Gastro-esophageal reflux disease without esophagitis: Secondary | ICD-10-CM | POA: Diagnosis not present

## 2017-09-04 DIAGNOSIS — Z0000 Encounter for general adult medical examination without abnormal findings: Secondary | ICD-10-CM | POA: Diagnosis not present

## 2017-09-04 DIAGNOSIS — J309 Allergic rhinitis, unspecified: Secondary | ICD-10-CM | POA: Diagnosis not present

## 2017-09-04 DIAGNOSIS — E559 Vitamin D deficiency, unspecified: Secondary | ICD-10-CM | POA: Diagnosis not present

## 2017-09-04 DIAGNOSIS — E1165 Type 2 diabetes mellitus with hyperglycemia: Secondary | ICD-10-CM | POA: Diagnosis not present

## 2017-09-04 DIAGNOSIS — Z124 Encounter for screening for malignant neoplasm of cervix: Secondary | ICD-10-CM | POA: Diagnosis not present

## 2017-09-04 DIAGNOSIS — I10 Essential (primary) hypertension: Secondary | ICD-10-CM | POA: Diagnosis not present

## 2017-09-04 DIAGNOSIS — E782 Mixed hyperlipidemia: Secondary | ICD-10-CM | POA: Diagnosis not present

## 2017-09-04 DIAGNOSIS — M8580 Other specified disorders of bone density and structure, unspecified site: Secondary | ICD-10-CM | POA: Diagnosis not present

## 2017-09-04 DIAGNOSIS — F419 Anxiety disorder, unspecified: Secondary | ICD-10-CM | POA: Diagnosis not present

## 2017-10-13 DIAGNOSIS — H2513 Age-related nuclear cataract, bilateral: Secondary | ICD-10-CM | POA: Diagnosis not present

## 2017-10-13 DIAGNOSIS — H25013 Cortical age-related cataract, bilateral: Secondary | ICD-10-CM | POA: Diagnosis not present

## 2017-10-13 DIAGNOSIS — Z7984 Long term (current) use of oral hypoglycemic drugs: Secondary | ICD-10-CM | POA: Diagnosis not present

## 2017-10-13 DIAGNOSIS — E119 Type 2 diabetes mellitus without complications: Secondary | ICD-10-CM | POA: Diagnosis not present

## 2017-10-15 DIAGNOSIS — F419 Anxiety disorder, unspecified: Secondary | ICD-10-CM | POA: Diagnosis not present

## 2017-10-15 DIAGNOSIS — E1165 Type 2 diabetes mellitus with hyperglycemia: Secondary | ICD-10-CM | POA: Diagnosis not present

## 2017-11-05 DIAGNOSIS — L309 Dermatitis, unspecified: Secondary | ICD-10-CM | POA: Diagnosis not present

## 2017-11-25 DIAGNOSIS — J309 Allergic rhinitis, unspecified: Secondary | ICD-10-CM | POA: Diagnosis not present

## 2017-11-25 DIAGNOSIS — H6983 Other specified disorders of Eustachian tube, bilateral: Secondary | ICD-10-CM | POA: Diagnosis not present

## 2017-12-02 DIAGNOSIS — E119 Type 2 diabetes mellitus without complications: Secondary | ICD-10-CM | POA: Diagnosis not present

## 2017-12-04 DIAGNOSIS — E1159 Type 2 diabetes mellitus with other circulatory complications: Secondary | ICD-10-CM | POA: Diagnosis not present

## 2018-03-06 DIAGNOSIS — I10 Essential (primary) hypertension: Secondary | ICD-10-CM | POA: Diagnosis not present

## 2018-03-06 DIAGNOSIS — F419 Anxiety disorder, unspecified: Secondary | ICD-10-CM | POA: Diagnosis not present

## 2018-03-06 DIAGNOSIS — Z0000 Encounter for general adult medical examination without abnormal findings: Secondary | ICD-10-CM | POA: Diagnosis not present

## 2018-03-06 DIAGNOSIS — E119 Type 2 diabetes mellitus without complications: Secondary | ICD-10-CM | POA: Diagnosis not present

## 2018-03-06 DIAGNOSIS — E782 Mixed hyperlipidemia: Secondary | ICD-10-CM | POA: Diagnosis not present

## 2018-03-06 DIAGNOSIS — E1165 Type 2 diabetes mellitus with hyperglycemia: Secondary | ICD-10-CM | POA: Diagnosis not present

## 2018-03-06 DIAGNOSIS — M8580 Other specified disorders of bone density and structure, unspecified site: Secondary | ICD-10-CM | POA: Diagnosis not present

## 2018-03-06 DIAGNOSIS — E559 Vitamin D deficiency, unspecified: Secondary | ICD-10-CM | POA: Diagnosis not present

## 2018-03-06 DIAGNOSIS — J309 Allergic rhinitis, unspecified: Secondary | ICD-10-CM | POA: Diagnosis not present

## 2018-03-10 DIAGNOSIS — E559 Vitamin D deficiency, unspecified: Secondary | ICD-10-CM | POA: Diagnosis not present

## 2018-03-10 DIAGNOSIS — M797 Fibromyalgia: Secondary | ICD-10-CM | POA: Diagnosis not present

## 2018-03-10 DIAGNOSIS — K219 Gastro-esophageal reflux disease without esophagitis: Secondary | ICD-10-CM | POA: Diagnosis not present

## 2018-03-10 DIAGNOSIS — M8580 Other specified disorders of bone density and structure, unspecified site: Secondary | ICD-10-CM | POA: Diagnosis not present

## 2018-03-10 DIAGNOSIS — E782 Mixed hyperlipidemia: Secondary | ICD-10-CM | POA: Diagnosis not present

## 2018-03-10 DIAGNOSIS — I10 Essential (primary) hypertension: Secondary | ICD-10-CM | POA: Diagnosis not present

## 2018-03-10 DIAGNOSIS — E118 Type 2 diabetes mellitus with unspecified complications: Secondary | ICD-10-CM | POA: Diagnosis not present

## 2018-03-10 DIAGNOSIS — Z23 Encounter for immunization: Secondary | ICD-10-CM | POA: Diagnosis not present

## 2018-03-10 DIAGNOSIS — F418 Other specified anxiety disorders: Secondary | ICD-10-CM | POA: Diagnosis not present

## 2018-08-10 ENCOUNTER — Other Ambulatory Visit: Payer: Self-pay | Admitting: Family Medicine

## 2018-08-10 DIAGNOSIS — Z1231 Encounter for screening mammogram for malignant neoplasm of breast: Secondary | ICD-10-CM

## 2018-09-08 DIAGNOSIS — E1165 Type 2 diabetes mellitus with hyperglycemia: Secondary | ICD-10-CM | POA: Diagnosis not present

## 2018-09-08 DIAGNOSIS — E782 Mixed hyperlipidemia: Secondary | ICD-10-CM | POA: Diagnosis not present

## 2018-09-08 DIAGNOSIS — E559 Vitamin D deficiency, unspecified: Secondary | ICD-10-CM | POA: Diagnosis not present

## 2018-09-08 DIAGNOSIS — M8580 Other specified disorders of bone density and structure, unspecified site: Secondary | ICD-10-CM | POA: Diagnosis not present

## 2018-09-08 DIAGNOSIS — Z13220 Encounter for screening for lipoid disorders: Secondary | ICD-10-CM | POA: Diagnosis not present

## 2018-09-08 DIAGNOSIS — Z0000 Encounter for general adult medical examination without abnormal findings: Secondary | ICD-10-CM | POA: Diagnosis not present

## 2018-09-08 DIAGNOSIS — Z6826 Body mass index (BMI) 26.0-26.9, adult: Secondary | ICD-10-CM | POA: Diagnosis not present

## 2018-09-15 DIAGNOSIS — E1159 Type 2 diabetes mellitus with other circulatory complications: Secondary | ICD-10-CM | POA: Diagnosis not present

## 2018-09-15 DIAGNOSIS — I10 Essential (primary) hypertension: Secondary | ICD-10-CM | POA: Diagnosis not present

## 2018-09-15 DIAGNOSIS — E559 Vitamin D deficiency, unspecified: Secondary | ICD-10-CM | POA: Diagnosis not present

## 2018-09-15 DIAGNOSIS — J309 Allergic rhinitis, unspecified: Secondary | ICD-10-CM | POA: Diagnosis not present

## 2018-09-15 DIAGNOSIS — E782 Mixed hyperlipidemia: Secondary | ICD-10-CM | POA: Diagnosis not present

## 2018-09-15 DIAGNOSIS — M8580 Other specified disorders of bone density and structure, unspecified site: Secondary | ICD-10-CM | POA: Diagnosis not present

## 2018-09-15 DIAGNOSIS — F418 Other specified anxiety disorders: Secondary | ICD-10-CM | POA: Diagnosis not present

## 2018-09-24 ENCOUNTER — Other Ambulatory Visit: Payer: Self-pay | Admitting: Physician Assistant

## 2018-09-24 DIAGNOSIS — M8580 Other specified disorders of bone density and structure, unspecified site: Secondary | ICD-10-CM

## 2018-10-06 ENCOUNTER — Ambulatory Visit
Admission: RE | Admit: 2018-10-06 | Discharge: 2018-10-06 | Disposition: A | Discharge status: Home / Self Care | Payer: PPO | Source: Ambulatory Visit | Attending: Family Medicine | Admitting: Family Medicine

## 2018-10-06 ENCOUNTER — Other Ambulatory Visit: Payer: Self-pay

## 2018-10-06 DIAGNOSIS — Z1231 Encounter for screening mammogram for malignant neoplasm of breast: Secondary | ICD-10-CM

## 2018-10-15 DIAGNOSIS — H25813 Combined forms of age-related cataract, bilateral: Secondary | ICD-10-CM | POA: Diagnosis not present

## 2018-10-15 DIAGNOSIS — E119 Type 2 diabetes mellitus without complications: Secondary | ICD-10-CM | POA: Diagnosis not present

## 2018-10-15 DIAGNOSIS — H524 Presbyopia: Secondary | ICD-10-CM | POA: Diagnosis not present

## 2018-12-08 ENCOUNTER — Other Ambulatory Visit: Payer: Medicare Other

## 2018-12-08 DIAGNOSIS — Z658 Other specified problems related to psychosocial circumstances: Secondary | ICD-10-CM | POA: Diagnosis not present

## 2018-12-08 DIAGNOSIS — M546 Pain in thoracic spine: Secondary | ICD-10-CM | POA: Diagnosis not present

## 2018-12-09 DIAGNOSIS — R110 Nausea: Secondary | ICD-10-CM | POA: Diagnosis not present

## 2018-12-09 DIAGNOSIS — R1011 Right upper quadrant pain: Secondary | ICD-10-CM | POA: Diagnosis not present

## 2018-12-11 ENCOUNTER — Emergency Department (HOSPITAL_BASED_OUTPATIENT_CLINIC_OR_DEPARTMENT_OTHER): Payer: PPO

## 2018-12-11 ENCOUNTER — Emergency Department (HOSPITAL_BASED_OUTPATIENT_CLINIC_OR_DEPARTMENT_OTHER)
Admission: EM | Admit: 2018-12-11 | Discharge: 2018-12-11 | Disposition: A | Discharge status: Home / Self Care | Payer: PPO | Attending: Emergency Medicine | Admitting: Emergency Medicine

## 2018-12-11 ENCOUNTER — Other Ambulatory Visit: Payer: Self-pay

## 2018-12-11 ENCOUNTER — Encounter (HOSPITAL_BASED_OUTPATIENT_CLINIC_OR_DEPARTMENT_OTHER): Payer: Self-pay

## 2018-12-11 DIAGNOSIS — R1084 Generalized abdominal pain: Secondary | ICD-10-CM | POA: Diagnosis not present

## 2018-12-11 DIAGNOSIS — I701 Atherosclerosis of renal artery: Secondary | ICD-10-CM | POA: Diagnosis not present

## 2018-12-11 DIAGNOSIS — Z7984 Long term (current) use of oral hypoglycemic drugs: Secondary | ICD-10-CM | POA: Diagnosis not present

## 2018-12-11 DIAGNOSIS — E785 Hyperlipidemia, unspecified: Secondary | ICD-10-CM | POA: Diagnosis not present

## 2018-12-11 DIAGNOSIS — Z79899 Other long term (current) drug therapy: Secondary | ICD-10-CM | POA: Insufficient documentation

## 2018-12-11 DIAGNOSIS — Z880 Allergy status to penicillin: Secondary | ICD-10-CM | POA: Insufficient documentation

## 2018-12-11 DIAGNOSIS — R112 Nausea with vomiting, unspecified: Secondary | ICD-10-CM | POA: Insufficient documentation

## 2018-12-11 DIAGNOSIS — R0789 Other chest pain: Secondary | ICD-10-CM | POA: Diagnosis present

## 2018-12-11 DIAGNOSIS — E119 Type 2 diabetes mellitus without complications: Secondary | ICD-10-CM | POA: Insufficient documentation

## 2018-12-11 DIAGNOSIS — I10 Essential (primary) hypertension: Secondary | ICD-10-CM | POA: Diagnosis not present

## 2018-12-11 DIAGNOSIS — E876 Hypokalemia: Secondary | ICD-10-CM | POA: Insufficient documentation

## 2018-12-11 DIAGNOSIS — R079 Chest pain, unspecified: Secondary | ICD-10-CM | POA: Diagnosis not present

## 2018-12-11 DIAGNOSIS — Z885 Allergy status to narcotic agent status: Secondary | ICD-10-CM | POA: Diagnosis not present

## 2018-12-11 DIAGNOSIS — Z881 Allergy status to other antibiotic agents status: Secondary | ICD-10-CM | POA: Insufficient documentation

## 2018-12-11 DIAGNOSIS — K760 Fatty (change of) liver, not elsewhere classified: Secondary | ICD-10-CM | POA: Diagnosis not present

## 2018-12-11 DIAGNOSIS — R110 Nausea: Secondary | ICD-10-CM

## 2018-12-11 LAB — CBC WITH DIFFERENTIAL/PLATELET
Abs Immature Granulocytes: 0.04 10*3/uL (ref 0.00–0.07)
Basophils Absolute: 0 10*3/uL (ref 0.0–0.1)
Basophils Relative: 0 %
Eosinophils Absolute: 0 10*3/uL (ref 0.0–0.5)
Eosinophils Relative: 0 %
HCT: 38.6 % (ref 36.0–46.0)
Hemoglobin: 13 g/dL (ref 12.0–15.0)
Immature Granulocytes: 0 %
Lymphocytes Relative: 18 %
Lymphs Abs: 1.7 10*3/uL (ref 0.7–4.0)
MCH: 30.2 pg (ref 26.0–34.0)
MCHC: 33.7 g/dL (ref 30.0–36.0)
MCV: 89.6 fL (ref 80.0–100.0)
Monocytes Absolute: 0.9 10*3/uL (ref 0.1–1.0)
Monocytes Relative: 10 %
Neutro Abs: 6.6 10*3/uL (ref 1.7–7.7)
Neutrophils Relative %: 72 %
Platelets: 241 10*3/uL (ref 150–400)
RBC: 4.31 MIL/uL (ref 3.87–5.11)
RDW: 12.8 % (ref 11.5–15.5)
WBC: 9.2 10*3/uL (ref 4.0–10.5)
nRBC: 0 % (ref 0.0–0.2)

## 2018-12-11 LAB — URINALYSIS, ROUTINE W REFLEX MICROSCOPIC
Bilirubin Urine: NEGATIVE
Glucose, UA: 250 mg/dL — AB
Hgb urine dipstick: NEGATIVE
Ketones, ur: 15 mg/dL — AB
Leukocytes,Ua: NEGATIVE
Nitrite: NEGATIVE
Protein, ur: 100 mg/dL — AB
Specific Gravity, Urine: 1.015 (ref 1.005–1.030)
pH: 5.5 (ref 5.0–8.0)

## 2018-12-11 LAB — COMPREHENSIVE METABOLIC PANEL
ALT: 17 U/L (ref 0–44)
AST: 16 U/L (ref 15–41)
Albumin: 4.2 g/dL (ref 3.5–5.0)
Alkaline Phosphatase: 59 U/L (ref 38–126)
Anion gap: 12 (ref 5–15)
BUN: 22 mg/dL (ref 8–23)
CO2: 25 mmol/L (ref 22–32)
Calcium: 9.1 mg/dL (ref 8.9–10.3)
Chloride: 97 mmol/L — ABNORMAL LOW (ref 98–111)
Creatinine, Ser: 0.74 mg/dL (ref 0.44–1.00)
GFR calc Af Amer: >60 mL/min (ref >60)
GFR calc non Af Amer: >60 mL/min (ref >60)
Glucose, Bld: 212 mg/dL — ABNORMAL HIGH (ref 70–99)
Potassium: 3 mmol/L — ABNORMAL LOW (ref 3.5–5.1)
Sodium: 134 mmol/L — ABNORMAL LOW (ref 135–145)
Total Bilirubin: 0.7 mg/dL (ref 0.3–1.2)
Total Protein: 7 g/dL (ref 6.5–8.1)

## 2018-12-11 LAB — URINALYSIS, MICROSCOPIC (REFLEX)

## 2018-12-11 LAB — TROPONIN I (HIGH SENSITIVITY): Troponin I (High Sensitivity): 11 ng/L (ref <18)

## 2018-12-11 LAB — LIPASE, BLOOD: Lipase: 20 U/L (ref 11–51)

## 2018-12-11 MED ORDER — POTASSIUM CHLORIDE CRYS ER 20 MEQ PO TBCR
40.0000 meq | EXTENDED_RELEASE_TABLET | Freq: Once | ORAL | Status: AC
  Administered 2018-12-11: 40 meq via ORAL
  Filled 2018-12-11: qty 2

## 2018-12-11 MED ORDER — IOHEXOL 350 MG/ML SOLN
100.0000 mL | Freq: Once | INTRAVENOUS | Status: AC | PRN
  Administered 2018-12-11: 100 mL via INTRAVENOUS

## 2018-12-11 MED ORDER — FLEET ENEMA 7-19 GM/118ML RE ENEM
1.0000 | ENEMA | Freq: Once | RECTAL | Status: AC
  Administered 2018-12-11: 1 via RECTAL
  Filled 2018-12-11: qty 1

## 2018-12-11 MED ORDER — SODIUM CHLORIDE 0.9 % IV BOLUS
500.0000 mL | Freq: Once | INTRAVENOUS | Status: AC
  Administered 2018-12-11: 500 mL via INTRAVENOUS

## 2018-12-11 MED ORDER — ALUM & MAG HYDROXIDE-SIMETH 200-200-20 MG/5ML PO SUSP
30.0000 mL | Freq: Once | ORAL | Status: AC
  Administered 2018-12-11: 30 mL via ORAL
  Filled 2018-12-11: qty 30

## 2018-12-11 MED ORDER — ONDANSETRON HCL 4 MG/2ML IJ SOLN
4.0000 mg | Freq: Once | INTRAMUSCULAR | Status: AC
  Administered 2018-12-11: 4 mg via INTRAVENOUS
  Filled 2018-12-11: qty 2

## 2018-12-11 MED ORDER — POTASSIUM CHLORIDE CRYS ER 20 MEQ PO TBCR: 20.0000 meq | EXTENDED_RELEASE_TABLET | Freq: Once | ORAL | 0 refills | Status: AC

## 2018-12-11 MED ORDER — ACETAMINOPHEN 325 MG PO TABS
650.0000 mg | ORAL_TABLET | Freq: Once | ORAL | Status: AC
Start: 2018-12-11 — End: 2018-12-11
  Administered 2018-12-11: 650 mg via ORAL
  Filled 2018-12-11: qty 2

## 2018-12-11 NOTE — ED Provider Notes (Signed)
[facility] EMERGENCY DEPARTMENT Provider Note   CSN: [id number] Arrival date & time: 12/11/18  1139     History   Chief Complaint Chief Complaint  Patient presents with  . Chest Pain    HPI Allison Eaton is a 78 y.o. female.     The history is provided by the patient and medical records. No language interpreter was used.  Chest Pain  Allison Eaton is a 78 y.o. female who presents to the Emergency Department complaining of chest pain. She presents to the emergency department complaining of five days of right sided chest pain. Pain is described as a twitching sensation in her right chest that radiates through to her right back. She has associated generalized abdominal pain with associated nausea and vomiting for the last two days. She denies any fevers, cough, shortness of breath, dysuria. She saw her PCP two days ago was prescribed medication for nausea and had a chest x-ray performed at that time. Overall her symptoms are worsening. No known COVID-19 exposures. No prior similar symptoms. She does have a history of hysterectomy, appendectomy, diabetes, hypertension. Past Medical History:  Diagnosis Date  . Diabetes mellitus without complication ([place])   . Hyperlipidemia   . Hypertension   . Osteopenia     There are no active problems to display for this patient.   History reviewed. No pertinent surgical history.   OB History   No obstetric history on file.      Home Medications    Prior to Admission medications   Medication Sig Start Date End Date Taking? Authorizing Provider  amLODipine (NORVASC) 5 MG tablet Take 5 mg by mouth daily.   Yes [provider]  benazepril (LOTENSIN) 20 MG tablet Take 20 mg by mouth daily.   Yes [provider]  calcium carbonate (OS-CAL) 600 MG TABS tablet Take 600 mg by mouth 2 (two) times daily with a meal.   Yes [provider]  Cholecalciferol (VITAMIN D-3) 5000 UNITS TABS Take 5,000  Units by mouth.   Yes [provider]  furosemide (LASIX) 20 MG tablet Take 20 mg by mouth.   Yes [provider]  glimepiride (AMARYL) 4 MG tablet Take 4 mg by mouth daily with breakfast.   Yes [provider]  Glucosamine-Chondroitin (OSTEO BI-FLEX REGULAR STRENGTH PO) Take by mouth daily.   Yes [provider]  magnesium oxide (MAG-OX) 400 MG tablet Take 400 mg by mouth daily.   Yes [provider]  metFORMIN (GLUCOPHAGE) 500 MG tablet Take 500 mg by mouth.   Yes [provider]  rosuvastatin (CRESTOR) 10 MG tablet Take 10 mg by mouth daily.   Yes [provider]  vitamin E 400 UNIT capsule Take 400 Units by mouth daily.   Yes [provider]  Zinc 50 MG CAPS Take by mouth.   Yes [provider]  potassium chloride SA (K-DUR) 20 MEQ tablet Take 1 tablet (20 mEq total) by mouth once for 1 dose. 12/11/18 12/11/18  Quintella Reichert, MD    Family History No family history on file.  Social History Social History   Tobacco Use  . Smoking status: Never Smoker  . Smokeless tobacco: Never Used  Substance Use Topics  . Alcohol use: Never    Frequency: Never  . Drug use: Never     Allergies   Clindamycin/lincomycin, Demerol [meperidine], Food, Morphine and related, Penicillins, and Sulfur   Review of Systems Review of Systems  Cardiovascular: Positive  for chest pain.  All other systems reviewed and are negative.    Physical Exam Updated Vital Signs BP (!) 191/69 (BP Location: Left Arm)   Pulse 63   Temp 98 F (36.7 C) (Oral)   Resp 13   LMP  (LMP Unknown)   SpO2 98%   Physical Exam Vitals signs and nursing note reviewed.  Constitutional:      Appearance: She is well-developed.  HENT:     Head: Normocephalic and atraumatic.  Cardiovascular:     Rate and Rhythm: Normal rate and regular rhythm.     Heart sounds: No murmur.  Pulmonary:     Effort: Pulmonary effort is normal. No respiratory  distress.     Breath sounds: Normal breath sounds.  Abdominal:     Palpations: Abdomen is soft.     Tenderness: There is no guarding or rebound.     Comments: Mild generalized abdominal tenderness  Musculoskeletal:        General: No swelling or tenderness.     Comments: 2+ radial and DP pulses bilaterally  Skin:    General: Skin is warm and dry.  Neurological:     Mental Status: She is alert and oriented to person, place, and time.  Psychiatric:        Mood and Affect: Mood normal.        Behavior: Behavior normal.      ED Treatments / Results  Labs (all labs ordered are listed, but only abnormal results are displayed) Labs Reviewed  URINALYSIS, ROUTINE W REFLEX MICROSCOPIC - Abnormal; Notable for the following components:      Result Value   Glucose, UA 250 (*)    Ketones, ur 15 (*)    Protein, ur 100 (*)    All other components within normal limits  COMPREHENSIVE METABOLIC PANEL - Abnormal; Notable for the following components:   Sodium 134 (*)    Potassium 3.0 (*)    Chloride 97 (*)    Glucose, Bld 212 (*)    All other components within normal limits  URINALYSIS, MICROSCOPIC (REFLEX) - Abnormal; Notable for the following components:   Bacteria, UA RARE (*)    All other components within normal limits  LIPASE, BLOOD  CBC WITH DIFFERENTIAL/PLATELET  TROPONIN I (HIGH SENSITIVITY)  TROPONIN I (HIGH SENSITIVITY)    EKG EKG Interpretation  Date/Time:  Friday December 11 2018 11:47:06 EDT Ventricular Rate:  64 PR Interval:    QRS Duration: 99 QT Interval:  441 QTC Calculation: 452 R Axis:   55 Text Interpretation:  Sinus rhythm Borderline short PR interval Low voltage, precordial leads  Baseline wander in lead(s) I III aVL no prior available for comparison Confirmed by Quintella Reichert [phone]) on 12/11/2018 11:51:19 AM   Radiology Ct Angio Chest/abd/pel For Dissection W And/or W/wo  Result Date: 12/11/2018 CLINICAL DATA:  Chest and back pain. EXAM: CT ANGIOGRAPHY  CHEST, ABDOMEN AND PELVIS TECHNIQUE: Multidetector CT imaging through the chest, abdomen and pelvis was performed using the standard protocol during bolus administration of intravenous contrast. Multiplanar reconstructed images and MIPs were obtained and reviewed to evaluate the vascular anatomy. CONTRAST:  159mL OMNIPAQUE IOHEXOL 350 MG/ML SOLN COMPARISON:  None. FINDINGS: CTA CHEST FINDINGS Cardiovascular: Preferential opacification of the thoracic aorta. No evidence of thoracic aortic aneurysm or dissection. Normal heart size. No pericardial effusion. Atherosclerosis of thoracic aorta is noted. Great vessels are widely patent without significant stenosis. Mediastinum/Nodes: No enlarged mediastinal, hilar, or axillary lymph nodes. Thyroid gland, trachea, and  esophagus demonstrate no significant findings. Lungs/Pleura: Lungs are clear. No pleural effusion or pneumothorax. Musculoskeletal: No chest wall abnormality. No acute or significant osseous findings. Review of the MIP images confirms the above findings. CTA ABDOMEN AND PELVIS FINDINGS VASCULAR Aorta: Atherosclerosis of thoracic aorta is noted without aneurysm or dissection. Celiac: Patent without evidence of aneurysm, dissection, vasculitis or significant stenosis. SMA: Patent without evidence of aneurysm, dissection, vasculitis or significant stenosis. Renals: Moderate focal stenosis is noted at the origin of the left renal artery secondary to calcified plaque. Right renal artery is widely patent. IMA: Patent without evidence of aneurysm, dissection, vasculitis or significant stenosis. Inflow: Patent without evidence of aneurysm, dissection, vasculitis or significant stenosis. Veins: No obvious venous abnormality within the limitations of this arterial phase study. Review of the MIP images confirms the above findings. NON-VASCULAR Hepatobiliary: Hepatic steatosis. No gallstones or biliary dilatation is noted. Pancreas: Unremarkable. No pancreatic ductal  dilatation or surrounding inflammatory changes. Spleen: Normal in size without focal abnormality. Adrenals/Urinary Tract: Adrenal glands are unremarkable. Kidneys are normal, without renal calculi, focal lesion, or hydronephrosis. Bladder is unremarkable. Stomach/Bowel: The stomach appears normal. There is no evidence of bowel obstruction or inflammation. The appendix is not visualized. Lymphatic: No significant adenopathy is noted. Reproductive: Status post hysterectomy. No adnexal masses. Other: No abdominal wall hernia or abnormality. No abdominopelvic ascites. Musculoskeletal: No acute or significant osseous findings. Review of the MIP images confirms the above findings. IMPRESSION: No evidence of thoracic or abdominal aortic dissection or aneurysm. Moderate stenosis is noted at the origin of the left renal artery secondary to calcified plaque. Hepatic steatosis. Aortic Atherosclerosis (ICD10-I70.0). Electronically Signed   By: Marijo Conception M.D.   On: 12/11/2018 14:17    Procedures Procedures (including critical care time)  Medications Ordered in ED Medications  ondansetron (ZOFRAN) injection 4 mg (4 mg Intravenous Given 12/11/18 1214)  potassium chloride SA (K-DUR) CR tablet 40 mEq (40 mEq Oral Given 12/11/18 1320)  alum & mag hydroxide-simeth (MAALOX/MYLANTA) 200-200-20 MG/5ML suspension 30 mL (30 mLs Oral Given 12/11/18 1320)  sodium chloride 0.9 % bolus 500 mL (0 mLs Intravenous Stopped 12/11/18 1442)  iohexol (OMNIPAQUE) 350 MG/ML injection 100 mL (100 mLs Intravenous Contrast Given 12/11/18 1340)  acetaminophen (TYLENOL) tablet 650 mg (650 mg Oral Given 12/11/18 1441)  sodium phosphate (FLEET) 7-19 GM/118ML enema 1 enema (1 enema Rectal Given 12/11/18 1442)     Initial Impression / Assessment and Plan / ED Course  I have reviewed the triage vital signs and the nursing notes.  Pertinent labs & imaging results that were available during my care of the patient were reviewed by me and  considered in my medical decision making (see chart for details).        Patient here for evaluation of chest pain, abdominal pain and nausea. On initial examination patient with mild generalized abdominal tenderness. Given the description of the symptoms the CTA was obtained, which is negative for dissection. On repeat assessment she is feeling improved, abdominal exam is soft and nontender. Labs are significant for hypokalemia. UA is not consistent with UTI. Patient does complain of constipation. Discussed with patient home care for hypokalemia, constipation, nausea. Discuss findings of CT scan with renal artery stenosis. Discussed importance of PCP follow-up as well as return precautions.  Patient is noted to be hypertensive in the emergency department today, that she did not take her medications and states that she is always hypertensive on Dr. visits. Presentation is not consistent with hypertensive urgency.  Final Clinical Impressions(s) / ED Diagnoses   Final diagnoses:  Nausea  Generalized abdominal pain  Hypokalemia    ED Discharge Orders         Ordered    potassium chloride SA (K-DUR) 20 MEQ tablet   Once     12/11/18 1513           Quintella Reichert, MD 12/11/18 1515

## 2018-12-11 NOTE — Discharge Instructions (Signed)
You had a CT scan of your chest, abdomen and pelvis performed today in the emergency department. It showed stenosis of your left renal artery. Please follow-up with your family doctor for recheck of your nausea and blood pressure in the next few days.  Get rechecked immediately if you develop worsening symptoms, fevers, or new concerning symptoms.    You can take, miralax, available of the counter as needed for constipation.

## 2018-12-11 NOTE — ED Triage Notes (Addendum)
C/o right side CP x 5 days-NAD-to tx area via w/c-seen by PCP 2 days ago-rx for nausea and CXR done-pt denies cough/fever/flu sx

## 2019-02-16 ENCOUNTER — Other Ambulatory Visit: Payer: PPO

## 2019-03-18 DIAGNOSIS — E559 Vitamin D deficiency, unspecified: Secondary | ICD-10-CM | POA: Diagnosis not present

## 2019-03-18 DIAGNOSIS — E782 Mixed hyperlipidemia: Secondary | ICD-10-CM | POA: Diagnosis not present

## 2019-03-18 DIAGNOSIS — I10 Essential (primary) hypertension: Secondary | ICD-10-CM | POA: Diagnosis not present

## 2019-03-18 DIAGNOSIS — E1159 Type 2 diabetes mellitus with other circulatory complications: Secondary | ICD-10-CM | POA: Diagnosis not present

## 2019-03-18 DIAGNOSIS — F418 Other specified anxiety disorders: Secondary | ICD-10-CM | POA: Diagnosis not present

## 2019-03-18 DIAGNOSIS — J309 Allergic rhinitis, unspecified: Secondary | ICD-10-CM | POA: Diagnosis not present

## 2019-03-18 DIAGNOSIS — M8580 Other specified disorders of bone density and structure, unspecified site: Secondary | ICD-10-CM | POA: Diagnosis not present

## 2019-03-25 DIAGNOSIS — J309 Allergic rhinitis, unspecified: Secondary | ICD-10-CM | POA: Diagnosis not present

## 2019-03-25 DIAGNOSIS — E782 Mixed hyperlipidemia: Secondary | ICD-10-CM | POA: Diagnosis not present

## 2019-03-25 DIAGNOSIS — I10 Essential (primary) hypertension: Secondary | ICD-10-CM | POA: Diagnosis not present

## 2019-03-25 DIAGNOSIS — M8580 Other specified disorders of bone density and structure, unspecified site: Secondary | ICD-10-CM | POA: Diagnosis not present

## 2019-03-25 DIAGNOSIS — F418 Other specified anxiety disorders: Secondary | ICD-10-CM | POA: Diagnosis not present

## 2019-03-25 DIAGNOSIS — K219 Gastro-esophageal reflux disease without esophagitis: Secondary | ICD-10-CM | POA: Diagnosis not present

## 2019-03-25 DIAGNOSIS — E559 Vitamin D deficiency, unspecified: Secondary | ICD-10-CM | POA: Diagnosis not present

## 2019-03-25 DIAGNOSIS — E1159 Type 2 diabetes mellitus with other circulatory complications: Secondary | ICD-10-CM | POA: Diagnosis not present

## 2019-03-25 DIAGNOSIS — Z8639 Personal history of other endocrine, nutritional and metabolic disease: Secondary | ICD-10-CM | POA: Diagnosis not present

## 2019-09-17 DIAGNOSIS — I10 Essential (primary) hypertension: Secondary | ICD-10-CM | POA: Diagnosis not present

## 2019-09-17 DIAGNOSIS — Z01419 Encounter for gynecological examination (general) (routine) without abnormal findings: Secondary | ICD-10-CM | POA: Diagnosis not present

## 2019-09-17 DIAGNOSIS — Z0000 Encounter for general adult medical examination without abnormal findings: Secondary | ICD-10-CM | POA: Diagnosis not present

## 2019-09-17 DIAGNOSIS — J309 Allergic rhinitis, unspecified: Secondary | ICD-10-CM | POA: Diagnosis not present

## 2019-09-17 DIAGNOSIS — E559 Vitamin D deficiency, unspecified: Secondary | ICD-10-CM | POA: Diagnosis not present

## 2019-09-17 DIAGNOSIS — E782 Mixed hyperlipidemia: Secondary | ICD-10-CM | POA: Diagnosis not present

## 2019-09-17 DIAGNOSIS — E1159 Type 2 diabetes mellitus with other circulatory complications: Secondary | ICD-10-CM | POA: Diagnosis not present

## 2019-09-17 DIAGNOSIS — M8580 Other specified disorders of bone density and structure, unspecified site: Secondary | ICD-10-CM | POA: Diagnosis not present

## 2019-09-17 DIAGNOSIS — Z7984 Long term (current) use of oral hypoglycemic drugs: Secondary | ICD-10-CM | POA: Diagnosis not present

## 2019-09-20 ENCOUNTER — Other Ambulatory Visit: Payer: Self-pay | Admitting: Family Medicine

## 2019-09-20 DIAGNOSIS — Z1231 Encounter for screening mammogram for malignant neoplasm of breast: Secondary | ICD-10-CM

## 2019-09-20 DIAGNOSIS — M8580 Other specified disorders of bone density and structure, unspecified site: Secondary | ICD-10-CM

## 2019-09-21 DIAGNOSIS — I10 Essential (primary) hypertension: Secondary | ICD-10-CM | POA: Diagnosis not present

## 2019-09-21 DIAGNOSIS — E559 Vitamin D deficiency, unspecified: Secondary | ICD-10-CM | POA: Diagnosis not present

## 2019-09-21 DIAGNOSIS — J309 Allergic rhinitis, unspecified: Secondary | ICD-10-CM | POA: Diagnosis not present

## 2019-09-21 DIAGNOSIS — K219 Gastro-esophageal reflux disease without esophagitis: Secondary | ICD-10-CM | POA: Diagnosis not present

## 2019-09-21 DIAGNOSIS — E782 Mixed hyperlipidemia: Secondary | ICD-10-CM | POA: Diagnosis not present

## 2019-09-21 DIAGNOSIS — M797 Fibromyalgia: Secondary | ICD-10-CM | POA: Diagnosis not present

## 2019-09-21 DIAGNOSIS — M8580 Other specified disorders of bone density and structure, unspecified site: Secondary | ICD-10-CM | POA: Diagnosis not present

## 2019-09-21 DIAGNOSIS — F418 Other specified anxiety disorders: Secondary | ICD-10-CM | POA: Diagnosis not present

## 2019-09-21 DIAGNOSIS — E1165 Type 2 diabetes mellitus with hyperglycemia: Secondary | ICD-10-CM | POA: Diagnosis not present

## 2019-10-19 DIAGNOSIS — E119 Type 2 diabetes mellitus without complications: Secondary | ICD-10-CM | POA: Diagnosis not present

## 2019-10-19 DIAGNOSIS — H524 Presbyopia: Secondary | ICD-10-CM | POA: Diagnosis not present

## 2019-10-19 DIAGNOSIS — H25813 Combined forms of age-related cataract, bilateral: Secondary | ICD-10-CM | POA: Diagnosis not present

## 2019-12-13 ENCOUNTER — Other Ambulatory Visit: Payer: Self-pay

## 2019-12-13 ENCOUNTER — Ambulatory Visit
Admission: RE | Admit: 2019-12-13 | Discharge: 2019-12-13 | Disposition: A | Discharge status: Home / Self Care | Payer: PPO | Source: Ambulatory Visit | Attending: Family Medicine | Admitting: Family Medicine

## 2019-12-13 ENCOUNTER — Ambulatory Visit
Admission: RE | Admit: 2019-12-13 | Discharge: 2019-12-13 | Disposition: A | Discharge status: Home / Self Care | Payer: Medicare HMO | Source: Ambulatory Visit | Attending: Family Medicine | Admitting: Family Medicine

## 2019-12-13 DIAGNOSIS — M8589 Other specified disorders of bone density and structure, multiple sites: Secondary | ICD-10-CM | POA: Diagnosis not present

## 2019-12-13 DIAGNOSIS — Z780 Asymptomatic menopausal state: Secondary | ICD-10-CM | POA: Diagnosis not present

## 2019-12-13 DIAGNOSIS — Z1231 Encounter for screening mammogram for malignant neoplasm of breast: Secondary | ICD-10-CM | POA: Diagnosis not present

## 2019-12-13 DIAGNOSIS — M8580 Other specified disorders of bone density and structure, unspecified site: Secondary | ICD-10-CM

## 2019-12-20 DIAGNOSIS — E860 Dehydration: Secondary | ICD-10-CM | POA: Diagnosis not present

## 2019-12-20 DIAGNOSIS — R197 Diarrhea, unspecified: Secondary | ICD-10-CM | POA: Diagnosis not present

## 2019-12-20 DIAGNOSIS — K529 Noninfective gastroenteritis and colitis, unspecified: Secondary | ICD-10-CM | POA: Diagnosis not present

## 2019-12-20 DIAGNOSIS — R112 Nausea with vomiting, unspecified: Secondary | ICD-10-CM | POA: Diagnosis not present

## 2020-01-12 DIAGNOSIS — R109 Unspecified abdominal pain: Secondary | ICD-10-CM | POA: Diagnosis not present

## 2020-01-13 DIAGNOSIS — I82422 Acute embolism and thrombosis of left iliac vein: Secondary | ICD-10-CM | POA: Diagnosis not present

## 2020-01-13 DIAGNOSIS — Z881 Allergy status to other antibiotic agents status: Secondary | ICD-10-CM | POA: Diagnosis not present

## 2020-01-13 DIAGNOSIS — I745 Embolism and thrombosis of iliac artery: Secondary | ICD-10-CM | POA: Diagnosis not present

## 2020-01-13 DIAGNOSIS — I82429 Acute embolism and thrombosis of unspecified iliac vein: Secondary | ICD-10-CM | POA: Diagnosis not present

## 2020-01-13 DIAGNOSIS — K449 Diaphragmatic hernia without obstruction or gangrene: Secondary | ICD-10-CM | POA: Diagnosis not present

## 2020-01-13 DIAGNOSIS — Z882 Allergy status to sulfonamides status: Secondary | ICD-10-CM | POA: Diagnosis not present

## 2020-01-13 DIAGNOSIS — E119 Type 2 diabetes mellitus without complications: Secondary | ICD-10-CM | POA: Diagnosis not present

## 2020-01-13 DIAGNOSIS — Z888 Allergy status to other drugs, medicaments and biological substances status: Secondary | ICD-10-CM | POA: Diagnosis not present

## 2020-01-13 DIAGNOSIS — E278 Other specified disorders of adrenal gland: Secondary | ICD-10-CM | POA: Diagnosis not present

## 2020-01-13 DIAGNOSIS — Z880 Allergy status to penicillin: Secondary | ICD-10-CM | POA: Diagnosis not present

## 2020-01-13 DIAGNOSIS — Z7902 Long term (current) use of antithrombotics/antiplatelets: Secondary | ICD-10-CM | POA: Diagnosis not present

## 2020-01-13 DIAGNOSIS — Z79899 Other long term (current) drug therapy: Secondary | ICD-10-CM | POA: Diagnosis not present

## 2020-01-13 DIAGNOSIS — R1030 Lower abdominal pain, unspecified: Secondary | ICD-10-CM | POA: Diagnosis not present

## 2020-01-14 DIAGNOSIS — I82422 Acute embolism and thrombosis of left iliac vein: Secondary | ICD-10-CM | POA: Diagnosis not present

## 2020-01-25 DIAGNOSIS — Z8616 Personal history of COVID-19: Secondary | ICD-10-CM | POA: Diagnosis not present

## 2020-01-25 DIAGNOSIS — N3000 Acute cystitis without hematuria: Secondary | ICD-10-CM | POA: Diagnosis not present

## 2020-01-25 DIAGNOSIS — I82422 Acute embolism and thrombosis of left iliac vein: Secondary | ICD-10-CM | POA: Diagnosis not present

## 2020-01-25 DIAGNOSIS — M8580 Other specified disorders of bone density and structure, unspecified site: Secondary | ICD-10-CM | POA: Diagnosis not present

## 2020-03-24 DIAGNOSIS — I10 Essential (primary) hypertension: Secondary | ICD-10-CM | POA: Diagnosis not present

## 2020-03-24 DIAGNOSIS — E1165 Type 2 diabetes mellitus with hyperglycemia: Secondary | ICD-10-CM | POA: Diagnosis not present

## 2020-03-24 DIAGNOSIS — K219 Gastro-esophageal reflux disease without esophagitis: Secondary | ICD-10-CM | POA: Diagnosis not present

## 2020-03-24 DIAGNOSIS — E559 Vitamin D deficiency, unspecified: Secondary | ICD-10-CM | POA: Diagnosis not present

## 2020-03-24 DIAGNOSIS — J309 Allergic rhinitis, unspecified: Secondary | ICD-10-CM | POA: Diagnosis not present

## 2020-03-24 DIAGNOSIS — E782 Mixed hyperlipidemia: Secondary | ICD-10-CM | POA: Diagnosis not present

## 2020-03-24 DIAGNOSIS — M8580 Other specified disorders of bone density and structure, unspecified site: Secondary | ICD-10-CM | POA: Diagnosis not present

## 2020-03-24 DIAGNOSIS — F418 Other specified anxiety disorders: Secondary | ICD-10-CM | POA: Diagnosis not present

## 2020-03-24 DIAGNOSIS — M797 Fibromyalgia: Secondary | ICD-10-CM | POA: Diagnosis not present

## 2020-04-03 DIAGNOSIS — E559 Vitamin D deficiency, unspecified: Secondary | ICD-10-CM | POA: Diagnosis not present

## 2020-04-03 DIAGNOSIS — M8580 Other specified disorders of bone density and structure, unspecified site: Secondary | ICD-10-CM | POA: Diagnosis not present

## 2020-04-03 DIAGNOSIS — I700 Atherosclerosis of aorta: Secondary | ICD-10-CM | POA: Diagnosis not present

## 2020-04-03 DIAGNOSIS — I10 Essential (primary) hypertension: Secondary | ICD-10-CM | POA: Diagnosis not present

## 2020-04-03 DIAGNOSIS — R42 Dizziness and giddiness: Secondary | ICD-10-CM | POA: Diagnosis not present

## 2020-04-03 DIAGNOSIS — R350 Frequency of micturition: Secondary | ICD-10-CM | POA: Diagnosis not present

## 2020-04-03 DIAGNOSIS — E782 Mixed hyperlipidemia: Secondary | ICD-10-CM | POA: Diagnosis not present

## 2020-04-03 DIAGNOSIS — E1159 Type 2 diabetes mellitus with other circulatory complications: Secondary | ICD-10-CM | POA: Diagnosis not present

## 2020-04-03 DIAGNOSIS — I82422 Acute embolism and thrombosis of left iliac vein: Secondary | ICD-10-CM | POA: Diagnosis not present

## 2020-06-05 DIAGNOSIS — I82422 Acute embolism and thrombosis of left iliac vein: Secondary | ICD-10-CM | POA: Diagnosis not present

## 2020-09-21 DIAGNOSIS — Z23 Encounter for immunization: Secondary | ICD-10-CM | POA: Diagnosis not present

## 2020-09-21 DIAGNOSIS — Z7984 Long term (current) use of oral hypoglycemic drugs: Secondary | ICD-10-CM | POA: Diagnosis not present

## 2020-09-21 DIAGNOSIS — E782 Mixed hyperlipidemia: Secondary | ICD-10-CM | POA: Diagnosis not present

## 2020-09-21 DIAGNOSIS — E1165 Type 2 diabetes mellitus with hyperglycemia: Secondary | ICD-10-CM | POA: Diagnosis not present

## 2020-09-21 DIAGNOSIS — Z0000 Encounter for general adult medical examination without abnormal findings: Secondary | ICD-10-CM | POA: Diagnosis not present

## 2020-09-21 DIAGNOSIS — E1159 Type 2 diabetes mellitus with other circulatory complications: Secondary | ICD-10-CM | POA: Diagnosis not present

## 2020-09-21 DIAGNOSIS — Z01419 Encounter for gynecological examination (general) (routine) without abnormal findings: Secondary | ICD-10-CM | POA: Diagnosis not present

## 2020-09-21 DIAGNOSIS — E559 Vitamin D deficiency, unspecified: Secondary | ICD-10-CM | POA: Diagnosis not present

## 2020-09-26 DIAGNOSIS — I700 Atherosclerosis of aorta: Secondary | ICD-10-CM | POA: Diagnosis not present

## 2020-10-05 DIAGNOSIS — K219 Gastro-esophageal reflux disease without esophagitis: Secondary | ICD-10-CM | POA: Diagnosis not present

## 2020-10-05 DIAGNOSIS — F418 Other specified anxiety disorders: Secondary | ICD-10-CM | POA: Diagnosis not present

## 2020-10-05 DIAGNOSIS — I10 Essential (primary) hypertension: Secondary | ICD-10-CM | POA: Diagnosis not present

## 2020-10-05 DIAGNOSIS — E782 Mixed hyperlipidemia: Secondary | ICD-10-CM | POA: Diagnosis not present

## 2020-10-05 DIAGNOSIS — J309 Allergic rhinitis, unspecified: Secondary | ICD-10-CM | POA: Diagnosis not present

## 2020-10-05 DIAGNOSIS — M8580 Other specified disorders of bone density and structure, unspecified site: Secondary | ICD-10-CM | POA: Diagnosis not present

## 2020-10-05 DIAGNOSIS — Z7984 Long term (current) use of oral hypoglycemic drugs: Secondary | ICD-10-CM | POA: Diagnosis not present

## 2020-10-05 DIAGNOSIS — E119 Type 2 diabetes mellitus without complications: Secondary | ICD-10-CM | POA: Diagnosis not present

## 2020-10-07 DIAGNOSIS — M8580 Other specified disorders of bone density and structure, unspecified site: Secondary | ICD-10-CM | POA: Diagnosis not present

## 2020-10-07 DIAGNOSIS — I10 Essential (primary) hypertension: Secondary | ICD-10-CM | POA: Diagnosis not present

## 2020-10-07 DIAGNOSIS — E119 Type 2 diabetes mellitus without complications: Secondary | ICD-10-CM | POA: Diagnosis not present

## 2020-10-07 DIAGNOSIS — K219 Gastro-esophageal reflux disease without esophagitis: Secondary | ICD-10-CM | POA: Diagnosis not present

## 2020-10-07 DIAGNOSIS — E782 Mixed hyperlipidemia: Secondary | ICD-10-CM | POA: Diagnosis not present

## 2020-10-07 DIAGNOSIS — E118 Type 2 diabetes mellitus with unspecified complications: Secondary | ICD-10-CM | POA: Diagnosis not present

## 2020-10-07 DIAGNOSIS — E1165 Type 2 diabetes mellitus with hyperglycemia: Secondary | ICD-10-CM | POA: Diagnosis not present

## 2020-10-07 DIAGNOSIS — E1159 Type 2 diabetes mellitus with other circulatory complications: Secondary | ICD-10-CM | POA: Diagnosis not present

## 2020-10-19 DIAGNOSIS — H524 Presbyopia: Secondary | ICD-10-CM | POA: Diagnosis not present

## 2020-10-19 DIAGNOSIS — Z7984 Long term (current) use of oral hypoglycemic drugs: Secondary | ICD-10-CM | POA: Diagnosis not present

## 2020-10-19 DIAGNOSIS — H25813 Combined forms of age-related cataract, bilateral: Secondary | ICD-10-CM | POA: Diagnosis not present

## 2020-10-19 DIAGNOSIS — E1136 Type 2 diabetes mellitus with diabetic cataract: Secondary | ICD-10-CM | POA: Diagnosis not present

## 2020-11-04 DIAGNOSIS — H52209 Unspecified astigmatism, unspecified eye: Secondary | ICD-10-CM | POA: Diagnosis not present

## 2020-11-04 DIAGNOSIS — H5203 Hypermetropia, bilateral: Secondary | ICD-10-CM | POA: Diagnosis not present

## 2020-11-21 ENCOUNTER — Other Ambulatory Visit: Payer: Self-pay | Admitting: Family Medicine

## 2020-11-21 DIAGNOSIS — Z1231 Encounter for screening mammogram for malignant neoplasm of breast: Secondary | ICD-10-CM

## 2020-12-05 DIAGNOSIS — E1159 Type 2 diabetes mellitus with other circulatory complications: Secondary | ICD-10-CM | POA: Diagnosis not present

## 2020-12-05 DIAGNOSIS — E118 Type 2 diabetes mellitus with unspecified complications: Secondary | ICD-10-CM | POA: Diagnosis not present

## 2020-12-05 DIAGNOSIS — M8580 Other specified disorders of bone density and structure, unspecified site: Secondary | ICD-10-CM | POA: Diagnosis not present

## 2020-12-05 DIAGNOSIS — E1165 Type 2 diabetes mellitus with hyperglycemia: Secondary | ICD-10-CM | POA: Diagnosis not present

## 2020-12-05 DIAGNOSIS — E782 Mixed hyperlipidemia: Secondary | ICD-10-CM | POA: Diagnosis not present

## 2020-12-05 DIAGNOSIS — E119 Type 2 diabetes mellitus without complications: Secondary | ICD-10-CM | POA: Diagnosis not present

## 2020-12-05 DIAGNOSIS — K219 Gastro-esophageal reflux disease without esophagitis: Secondary | ICD-10-CM | POA: Diagnosis not present

## 2020-12-05 DIAGNOSIS — I10 Essential (primary) hypertension: Secondary | ICD-10-CM | POA: Diagnosis not present

## 2020-12-14 IMAGING — MG DIGITAL SCREENING BILAT W/ TOMO W/ CAD
8 series · 8 of 24 positions shown · non-contrast
Comparison: Previous exam(s).

CLINICAL DATA: Screening.

EXAM:
DIGITAL SCREENING BILATERAL MAMMOGRAM WITH TOMO AND CAD

[R CC synth-2D]
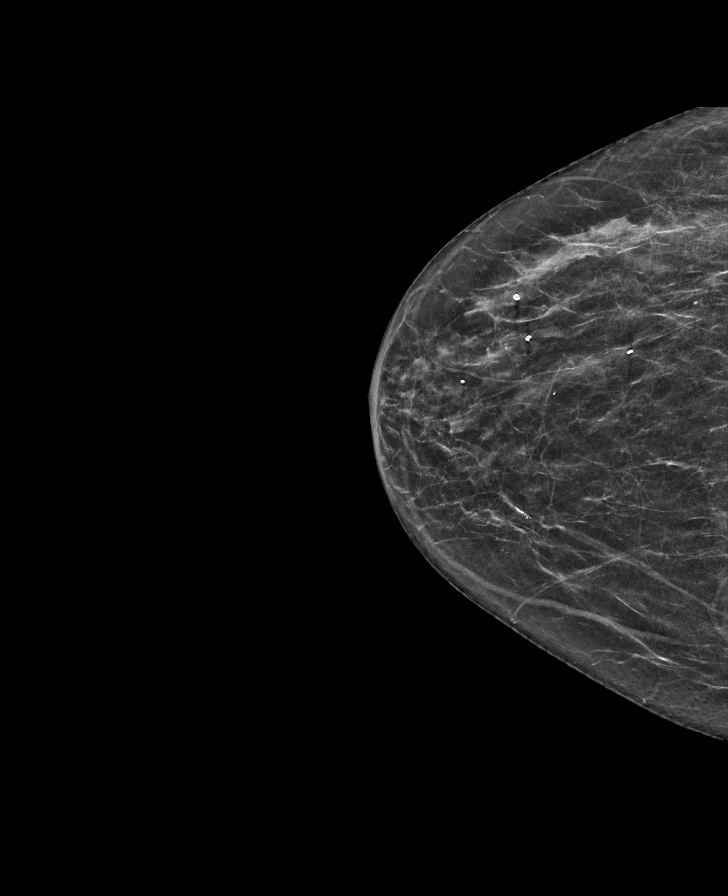

[L CC synth-2D]
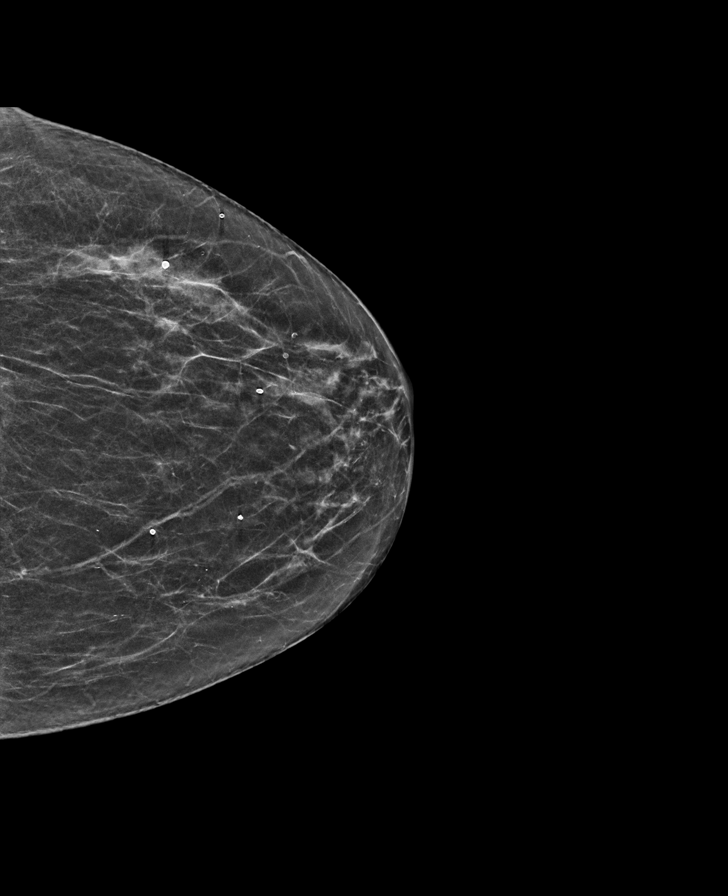

[L MLO synth-2D]
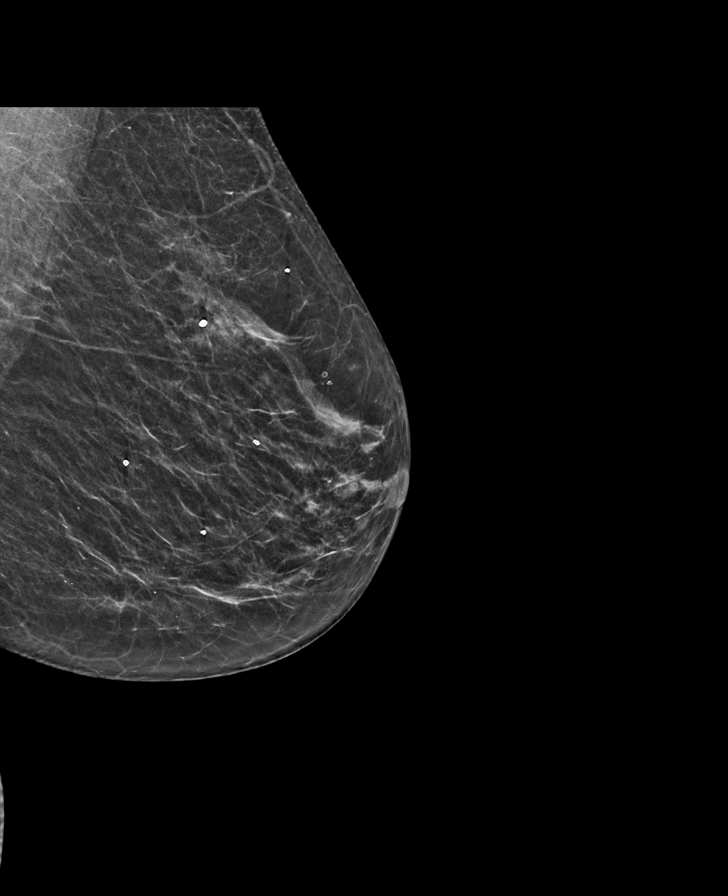

[R MLO synth-2D]
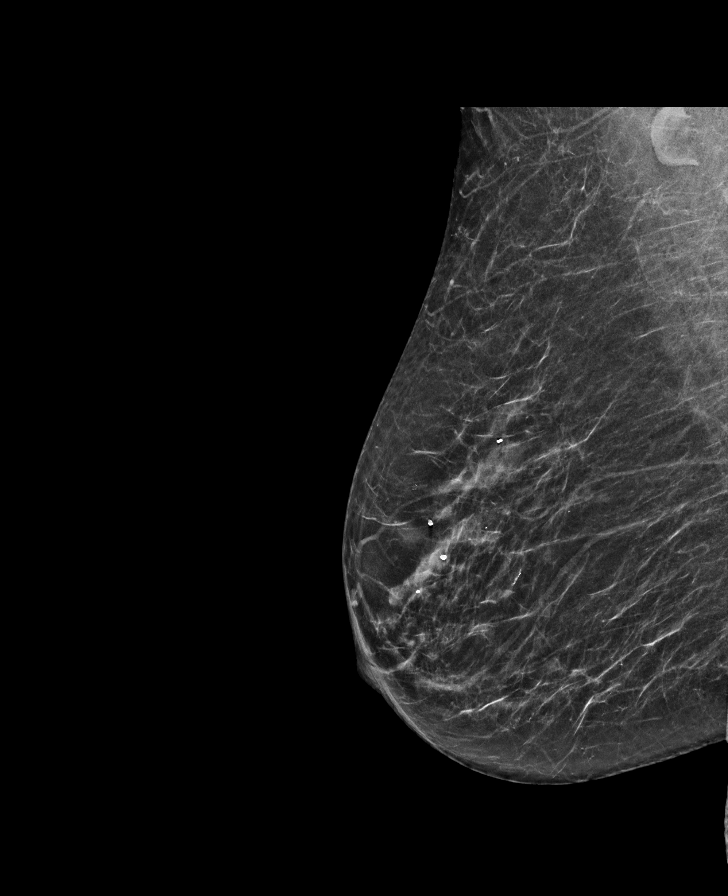

[R MLO tomo · tomo slice 29/56.0]
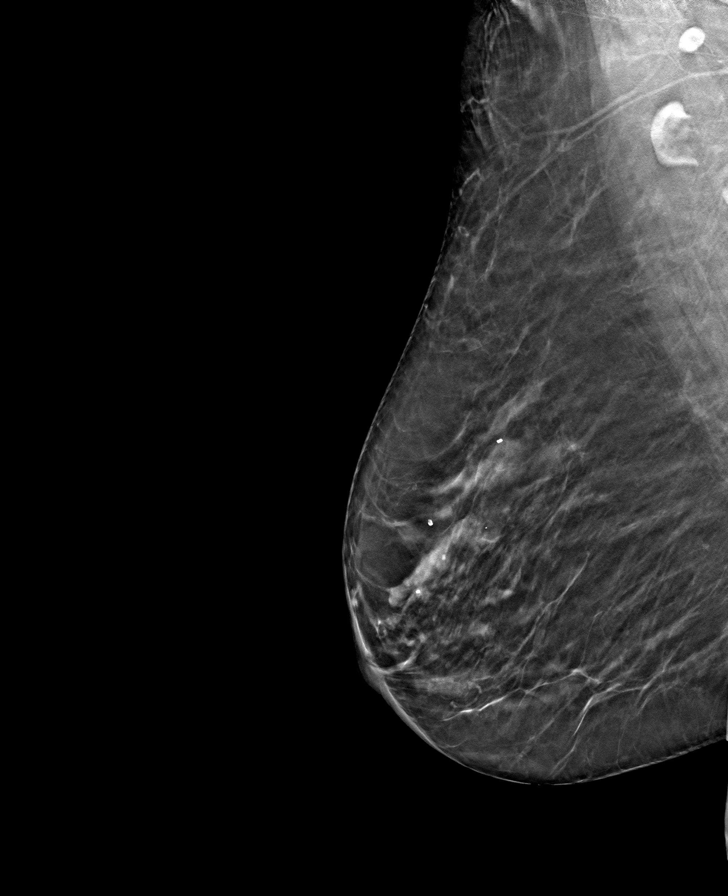

[L CC tomo · tomo slice 27/53.0]
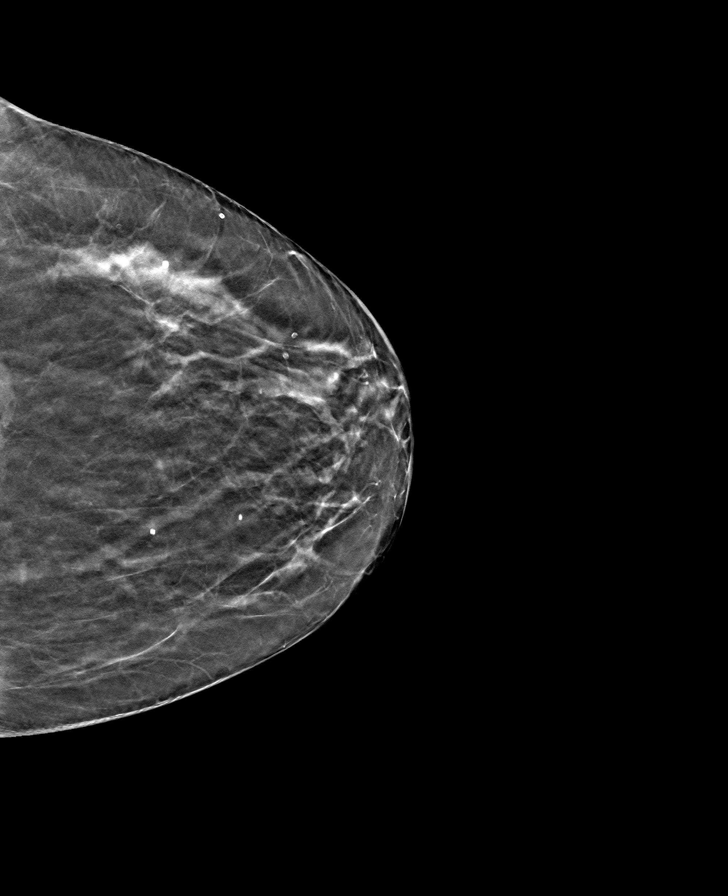

[R CC tomo · tomo slice 27/52.0]
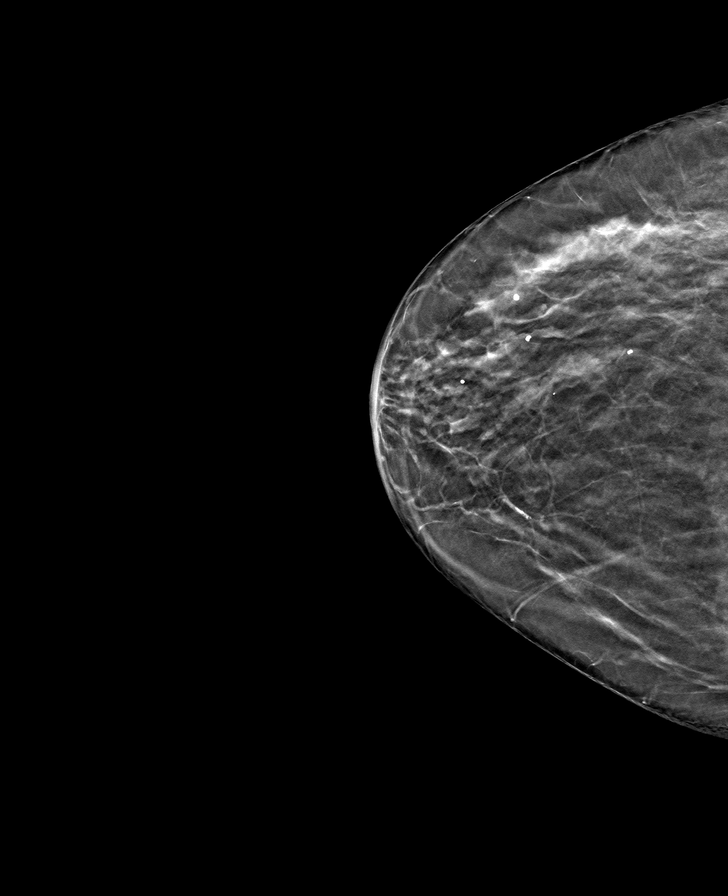

[L MLO tomo · tomo slice 27/52.0]
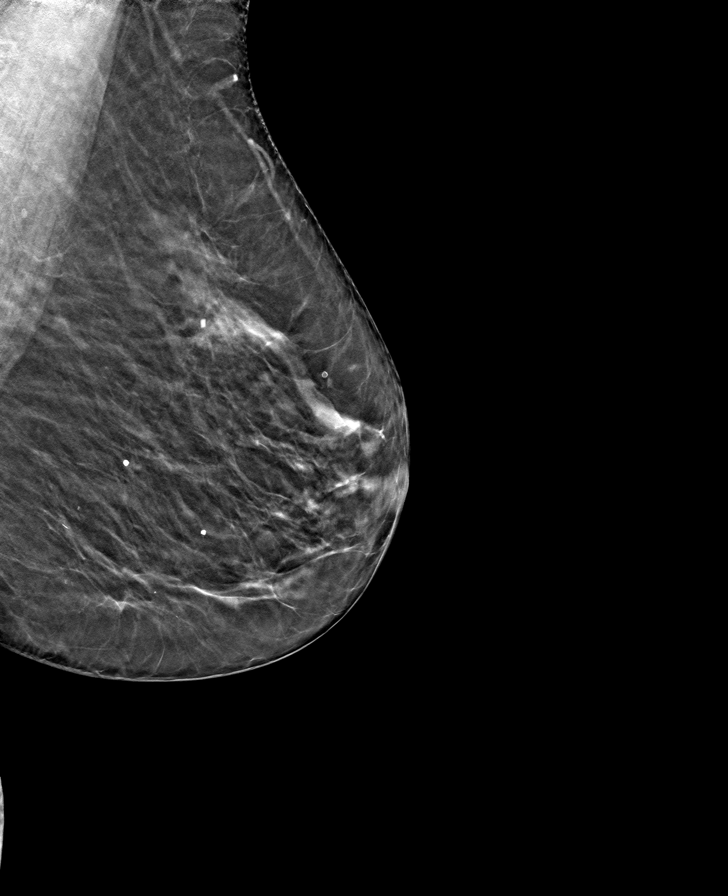

[8 of 24 positions shown; findings below may reference images not displayed]

ACR Breast Density Category b: There are scattered areas of
fibroglandular density.
FINDINGS: There are no findings suspicious for malignancy. Images were
processed with CAD.
IMPRESSION: No mammographic evidence of malignancy. A result letter of this
screening mammogram will be mailed directly to the patient.

RECOMMENDATION:
Screening mammogram in one year. (Code:[id number])

BI-RADS CATEGORY  1: Negative.

## 2021-01-15 ENCOUNTER — Other Ambulatory Visit: Payer: Self-pay

## 2021-01-15 ENCOUNTER — Ambulatory Visit
Admission: RE | Admit: 2021-01-15 | Discharge: 2021-01-15 | Disposition: A | Discharge status: Home / Self Care | Payer: Medicare HMO | Source: Ambulatory Visit | Attending: Family Medicine | Admitting: Family Medicine

## 2021-01-15 DIAGNOSIS — Z1231 Encounter for screening mammogram for malignant neoplasm of breast: Secondary | ICD-10-CM | POA: Diagnosis not present

## 2021-02-10 DIAGNOSIS — E118 Type 2 diabetes mellitus with unspecified complications: Secondary | ICD-10-CM | POA: Diagnosis not present

## 2021-02-10 DIAGNOSIS — E782 Mixed hyperlipidemia: Secondary | ICD-10-CM | POA: Diagnosis not present

## 2021-02-10 DIAGNOSIS — E1159 Type 2 diabetes mellitus with other circulatory complications: Secondary | ICD-10-CM | POA: Diagnosis not present

## 2021-02-10 DIAGNOSIS — E119 Type 2 diabetes mellitus without complications: Secondary | ICD-10-CM | POA: Diagnosis not present

## 2021-02-10 DIAGNOSIS — M8580 Other specified disorders of bone density and structure, unspecified site: Secondary | ICD-10-CM | POA: Diagnosis not present

## 2021-02-10 DIAGNOSIS — I10 Essential (primary) hypertension: Secondary | ICD-10-CM | POA: Diagnosis not present

## 2021-02-10 DIAGNOSIS — E1165 Type 2 diabetes mellitus with hyperglycemia: Secondary | ICD-10-CM | POA: Diagnosis not present

## 2021-02-10 DIAGNOSIS — K219 Gastro-esophageal reflux disease without esophagitis: Secondary | ICD-10-CM | POA: Diagnosis not present

## 2021-03-06 DIAGNOSIS — Z20822 Contact with and (suspected) exposure to covid-19: Secondary | ICD-10-CM | POA: Diagnosis not present

## 2021-03-06 DIAGNOSIS — B349 Viral infection, unspecified: Secondary | ICD-10-CM | POA: Diagnosis not present

## 2021-03-06 DIAGNOSIS — R051 Acute cough: Secondary | ICD-10-CM | POA: Diagnosis not present

## 2021-03-06 DIAGNOSIS — R509 Fever, unspecified: Secondary | ICD-10-CM | POA: Diagnosis not present

## 2021-03-06 DIAGNOSIS — U071 COVID-19: Secondary | ICD-10-CM | POA: Diagnosis not present

## 2021-03-28 DIAGNOSIS — K219 Gastro-esophageal reflux disease without esophagitis: Secondary | ICD-10-CM | POA: Diagnosis not present

## 2021-03-28 DIAGNOSIS — I10 Essential (primary) hypertension: Secondary | ICD-10-CM | POA: Diagnosis not present

## 2021-03-28 DIAGNOSIS — E118 Type 2 diabetes mellitus with unspecified complications: Secondary | ICD-10-CM | POA: Diagnosis not present

## 2021-03-28 DIAGNOSIS — M8580 Other specified disorders of bone density and structure, unspecified site: Secondary | ICD-10-CM | POA: Diagnosis not present

## 2021-03-28 DIAGNOSIS — E1165 Type 2 diabetes mellitus with hyperglycemia: Secondary | ICD-10-CM | POA: Diagnosis not present

## 2021-03-28 DIAGNOSIS — E1159 Type 2 diabetes mellitus with other circulatory complications: Secondary | ICD-10-CM | POA: Diagnosis not present

## 2021-03-28 DIAGNOSIS — E782 Mixed hyperlipidemia: Secondary | ICD-10-CM | POA: Diagnosis not present

## 2021-03-30 DIAGNOSIS — M545 Low back pain, unspecified: Secondary | ICD-10-CM | POA: Diagnosis not present

## 2021-05-07 DIAGNOSIS — E119 Type 2 diabetes mellitus without complications: Secondary | ICD-10-CM | POA: Diagnosis not present

## 2021-05-07 DIAGNOSIS — Z7984 Long term (current) use of oral hypoglycemic drugs: Secondary | ICD-10-CM | POA: Diagnosis not present

## 2021-05-14 DIAGNOSIS — Z23 Encounter for immunization: Secondary | ICD-10-CM | POA: Diagnosis not present

## 2021-05-14 DIAGNOSIS — M8580 Other specified disorders of bone density and structure, unspecified site: Secondary | ICD-10-CM | POA: Diagnosis not present

## 2021-05-14 DIAGNOSIS — E782 Mixed hyperlipidemia: Secondary | ICD-10-CM | POA: Diagnosis not present

## 2021-05-14 DIAGNOSIS — I10 Essential (primary) hypertension: Secondary | ICD-10-CM | POA: Diagnosis not present

## 2021-05-14 DIAGNOSIS — F418 Other specified anxiety disorders: Secondary | ICD-10-CM | POA: Diagnosis not present

## 2021-05-14 DIAGNOSIS — E1165 Type 2 diabetes mellitus with hyperglycemia: Secondary | ICD-10-CM | POA: Diagnosis not present

## 2021-05-14 DIAGNOSIS — Z7984 Long term (current) use of oral hypoglycemic drugs: Secondary | ICD-10-CM | POA: Diagnosis not present

## 2021-06-29 DIAGNOSIS — E119 Type 2 diabetes mellitus without complications: Secondary | ICD-10-CM | POA: Diagnosis not present

## 2021-06-29 DIAGNOSIS — I10 Essential (primary) hypertension: Secondary | ICD-10-CM | POA: Diagnosis not present

## 2021-06-29 DIAGNOSIS — E782 Mixed hyperlipidemia: Secondary | ICD-10-CM | POA: Diagnosis not present

## 2021-06-29 DIAGNOSIS — E1165 Type 2 diabetes mellitus with hyperglycemia: Secondary | ICD-10-CM | POA: Diagnosis not present

## 2021-08-14 DIAGNOSIS — Z6827 Body mass index (BMI) 27.0-27.9, adult: Secondary | ICD-10-CM | POA: Diagnosis not present

## 2021-08-14 DIAGNOSIS — K529 Noninfective gastroenteritis and colitis, unspecified: Secondary | ICD-10-CM | POA: Diagnosis not present

## 2021-09-26 DIAGNOSIS — Z23 Encounter for immunization: Secondary | ICD-10-CM | POA: Diagnosis not present

## 2021-09-26 DIAGNOSIS — Z1389 Encounter for screening for other disorder: Secondary | ICD-10-CM | POA: Diagnosis not present

## 2021-09-26 DIAGNOSIS — Z0000 Encounter for general adult medical examination without abnormal findings: Secondary | ICD-10-CM | POA: Diagnosis not present

## 2021-11-13 DIAGNOSIS — H524 Presbyopia: Secondary | ICD-10-CM | POA: Diagnosis not present

## 2021-11-13 DIAGNOSIS — H52203 Unspecified astigmatism, bilateral: Secondary | ICD-10-CM | POA: Diagnosis not present

## 2021-11-13 DIAGNOSIS — H5203 Hypermetropia, bilateral: Secondary | ICD-10-CM | POA: Diagnosis not present

## 2021-11-13 DIAGNOSIS — E1136 Type 2 diabetes mellitus with diabetic cataract: Secondary | ICD-10-CM | POA: Diagnosis not present

## 2021-11-13 DIAGNOSIS — H25813 Combined forms of age-related cataract, bilateral: Secondary | ICD-10-CM | POA: Diagnosis not present

## 2021-12-13 ENCOUNTER — Other Ambulatory Visit: Payer: Self-pay | Admitting: Family Medicine

## 2021-12-13 DIAGNOSIS — Z1231 Encounter for screening mammogram for malignant neoplasm of breast: Secondary | ICD-10-CM

## 2021-12-24 DIAGNOSIS — E782 Mixed hyperlipidemia: Secondary | ICD-10-CM | POA: Diagnosis not present

## 2021-12-24 DIAGNOSIS — E1165 Type 2 diabetes mellitus with hyperglycemia: Secondary | ICD-10-CM | POA: Diagnosis not present

## 2021-12-24 DIAGNOSIS — E559 Vitamin D deficiency, unspecified: Secondary | ICD-10-CM | POA: Diagnosis not present

## 2021-12-24 DIAGNOSIS — M8580 Other specified disorders of bone density and structure, unspecified site: Secondary | ICD-10-CM | POA: Diagnosis not present

## 2021-12-31 DIAGNOSIS — M8580 Other specified disorders of bone density and structure, unspecified site: Secondary | ICD-10-CM | POA: Diagnosis not present

## 2021-12-31 DIAGNOSIS — F418 Other specified anxiety disorders: Secondary | ICD-10-CM | POA: Diagnosis not present

## 2021-12-31 DIAGNOSIS — Z23 Encounter for immunization: Secondary | ICD-10-CM | POA: Diagnosis not present

## 2021-12-31 DIAGNOSIS — I10 Essential (primary) hypertension: Secondary | ICD-10-CM | POA: Diagnosis not present

## 2021-12-31 DIAGNOSIS — E119 Type 2 diabetes mellitus without complications: Secondary | ICD-10-CM | POA: Diagnosis not present

## 2021-12-31 DIAGNOSIS — I700 Atherosclerosis of aorta: Secondary | ICD-10-CM | POA: Diagnosis not present

## 2021-12-31 DIAGNOSIS — E782 Mixed hyperlipidemia: Secondary | ICD-10-CM | POA: Diagnosis not present

## 2021-12-31 DIAGNOSIS — K219 Gastro-esophageal reflux disease without esophagitis: Secondary | ICD-10-CM | POA: Diagnosis not present

## 2021-12-31 DIAGNOSIS — D692 Other nonthrombocytopenic purpura: Secondary | ICD-10-CM | POA: Diagnosis not present

## 2022-01-01 DIAGNOSIS — I10 Essential (primary) hypertension: Secondary | ICD-10-CM | POA: Diagnosis not present

## 2022-01-01 DIAGNOSIS — E782 Mixed hyperlipidemia: Secondary | ICD-10-CM | POA: Diagnosis not present

## 2022-01-01 DIAGNOSIS — E1165 Type 2 diabetes mellitus with hyperglycemia: Secondary | ICD-10-CM | POA: Diagnosis not present

## 2022-01-16 ENCOUNTER — Ambulatory Visit
Admission: RE | Admit: 2022-01-16 | Discharge: 2022-01-16 | Disposition: A | Discharge status: Home / Self Care | Payer: Medicare HMO | Source: Ambulatory Visit | Attending: Family Medicine | Admitting: Family Medicine

## 2022-01-16 DIAGNOSIS — Z1231 Encounter for screening mammogram for malignant neoplasm of breast: Secondary | ICD-10-CM | POA: Diagnosis not present

## 2022-05-23 DIAGNOSIS — M62830 Muscle spasm of back: Secondary | ICD-10-CM | POA: Diagnosis not present

## 2022-05-23 DIAGNOSIS — Z6827 Body mass index (BMI) 27.0-27.9, adult: Secondary | ICD-10-CM | POA: Diagnosis not present

## 2022-07-24 DIAGNOSIS — K219 Gastro-esophageal reflux disease without esophagitis: Secondary | ICD-10-CM | POA: Diagnosis not present

## 2022-07-24 DIAGNOSIS — E1136 Type 2 diabetes mellitus with diabetic cataract: Secondary | ICD-10-CM | POA: Diagnosis not present

## 2022-07-24 DIAGNOSIS — I10 Essential (primary) hypertension: Secondary | ICD-10-CM | POA: Diagnosis not present

## 2022-07-24 DIAGNOSIS — F3342 Major depressive disorder, recurrent, in full remission: Secondary | ICD-10-CM | POA: Diagnosis not present

## 2022-07-24 DIAGNOSIS — M8580 Other specified disorders of bone density and structure, unspecified site: Secondary | ICD-10-CM | POA: Diagnosis not present

## 2022-07-24 DIAGNOSIS — I700 Atherosclerosis of aorta: Secondary | ICD-10-CM | POA: Diagnosis not present

## 2022-07-24 DIAGNOSIS — E782 Mixed hyperlipidemia: Secondary | ICD-10-CM | POA: Diagnosis not present

## 2022-07-24 DIAGNOSIS — E559 Vitamin D deficiency, unspecified: Secondary | ICD-10-CM | POA: Diagnosis not present

## 2022-07-24 DIAGNOSIS — E1165 Type 2 diabetes mellitus with hyperglycemia: Secondary | ICD-10-CM | POA: Diagnosis not present

## 2022-12-10 ENCOUNTER — Other Ambulatory Visit: Payer: Self-pay | Admitting: Family Medicine

## 2022-12-10 DIAGNOSIS — Z0000 Encounter for general adult medical examination without abnormal findings: Secondary | ICD-10-CM

## 2023-01-21 ENCOUNTER — Ambulatory Visit: Payer: Medicare HMO

## 2023-01-23 ENCOUNTER — Ambulatory Visit
Admission: RE | Admit: 2023-01-23 | Discharge: 2023-01-23 | Disposition: A | Discharge status: Home / Self Care | Payer: Medicare HMO | Source: Ambulatory Visit | Attending: Family Medicine | Admitting: Family Medicine

## 2023-01-23 DIAGNOSIS — Z1231 Encounter for screening mammogram for malignant neoplasm of breast: Secondary | ICD-10-CM | POA: Diagnosis not present

## 2023-01-23 DIAGNOSIS — Z0000 Encounter for general adult medical examination without abnormal findings: Secondary | ICD-10-CM

## 2023-02-04 ENCOUNTER — Emergency Department (HOSPITAL_COMMUNITY): Payer: Medicare HMO

## 2023-02-04 ENCOUNTER — Encounter (HOSPITAL_COMMUNITY): Payer: Self-pay

## 2023-02-04 ENCOUNTER — Emergency Department (HOSPITAL_COMMUNITY)
Admission: EM | Admit: 2023-02-04 | Discharge: 2023-02-04 | Disposition: A | Discharge status: Home / Self Care | Payer: Medicare HMO | Attending: Emergency Medicine | Admitting: Emergency Medicine

## 2023-02-04 DIAGNOSIS — S01111A Laceration without foreign body of right eyelid and periocular area, initial encounter: Secondary | ICD-10-CM | POA: Diagnosis not present

## 2023-02-04 DIAGNOSIS — W19XXXA Unspecified fall, initial encounter: Secondary | ICD-10-CM | POA: Insufficient documentation

## 2023-02-04 DIAGNOSIS — I10 Essential (primary) hypertension: Secondary | ICD-10-CM | POA: Diagnosis not present

## 2023-02-04 DIAGNOSIS — S0181XA Laceration without foreign body of other part of head, initial encounter: Secondary | ICD-10-CM | POA: Diagnosis not present

## 2023-02-04 DIAGNOSIS — Y92524 Gas station as the place of occurrence of the external cause: Secondary | ICD-10-CM | POA: Diagnosis not present

## 2023-02-04 DIAGNOSIS — Z23 Encounter for immunization: Secondary | ICD-10-CM | POA: Diagnosis not present

## 2023-02-04 DIAGNOSIS — S199XXA Unspecified injury of neck, initial encounter: Secondary | ICD-10-CM | POA: Diagnosis not present

## 2023-02-04 DIAGNOSIS — M5020 Other cervical disc displacement, unspecified cervical region: Secondary | ICD-10-CM | POA: Insufficient documentation

## 2023-02-04 DIAGNOSIS — S0993XA Unspecified injury of face, initial encounter: Secondary | ICD-10-CM | POA: Diagnosis not present

## 2023-02-04 DIAGNOSIS — Y9301 Activity, walking, marching and hiking: Secondary | ICD-10-CM | POA: Insufficient documentation

## 2023-02-04 DIAGNOSIS — I6782 Cerebral ischemia: Secondary | ICD-10-CM | POA: Diagnosis not present

## 2023-02-04 DIAGNOSIS — S0990XA Unspecified injury of head, initial encounter: Secondary | ICD-10-CM | POA: Diagnosis not present

## 2023-02-04 MED ORDER — TETANUS-DIPHTH-ACELL PERTUSSIS 5-2.5-18.5 LF-MCG/0.5 IM SUSY
0.5000 mL | PREFILLED_SYRINGE | Freq: Once | INTRAMUSCULAR | Status: AC
  Administered 2023-02-04: 0.5 mL via INTRAMUSCULAR
  Filled 2023-02-04: qty 0.5

## 2023-02-04 NOTE — ED Notes (Signed)
Ambulated pt. in room, O2 sats remained 100% and no abnormalities noted.

## 2023-02-04 NOTE — ED Provider Notes (Signed)
[place] EMERGENCY DEPARTMENT AT [facility] Provider Note   CSN: [id number] Arrival date & time: 02/04/23  1559    History  Chief Complaint  Patient presents with   Allison Eaton is a 82 y.o. female here for evaluation mechanical fall.  Was outside at a gas station when she tripped and fell walking over a curb.  Uses a cane at baseline.  She was ambulatory PTA.  She denies any syncope or presyncopal symptoms.  She is able to get up after the fall.  She noted a laceration to the right side and posterior aspect of her head.  EMS was called.  No LOC, anticoagulation.  She denies any other pain.  No vision changes, neck pain, back pain, chest pain, Abdominal pain.  No numbness or weakness.  HPI     Home Medications Prior to Admission medications   Medication Sig Start Date End Date Taking? Authorizing Provider  amLODipine (NORVASC) 5 MG tablet Take 5 mg by mouth daily.    [provider]  benazepril (LOTENSIN) 20 MG tablet Take 20 mg by mouth daily.    [provider]  calcium carbonate (OS-CAL) 600 MG TABS tablet Take 600 mg by mouth 2 (two) times daily with a meal.    [provider]  Cholecalciferol (VITAMIN D-3) 5000 UNITS TABS Take 5,000 Units by mouth.    [provider]  furosemide (LASIX) 20 MG tablet Take 20 mg by mouth.    [provider]  glimepiride (AMARYL) 4 MG tablet Take 4 mg by mouth daily with breakfast.    [provider]  Glucosamine-Chondroitin (OSTEO BI-FLEX REGULAR STRENGTH PO) Take by mouth daily.    [provider]  magnesium oxide (MAG-OX) 400 MG tablet Take 400 mg by mouth daily.    [provider]  metFORMIN (GLUCOPHAGE) 500 MG tablet Take 500 mg by mouth.    [provider]  potassium chloride SA (K-DUR) 20 MEQ tablet Take 1 tablet (20 mEq total) by mouth once for 1 dose. 12/11/18 12/11/18  Tilden Fossa, MD  rosuvastatin (CRESTOR) 10 MG tablet Take 10  mg by mouth daily.    [provider]  vitamin E 400 UNIT capsule Take 400 Units by mouth daily.    [provider]  Zinc 50 MG CAPS Take by mouth.    [provider]      Allergies    Clindamycin/lincomycin, Demerol [meperidine], Elemental sulfur, Food, Morphine and codeine, and Penicillins    Review of Systems   Review of Systems  Constitutional: Negative.   HENT: Negative.    Eyes: Negative.   Respiratory: Negative.    Cardiovascular: Negative.   Gastrointestinal: Negative.   Genitourinary: Negative.   Musculoskeletal: Negative.   Skin: Negative.   Neurological: Negative.   All other systems reviewed and are negative.  Physical Exam Updated Vital Signs BP 138/64   Pulse 66   Temp 97.8 F (36.6 C) (Temporal)   Resp 12   Ht 5\' 3"  (1.6 m)   Wt 64.4 kg   LMP  (LMP Unknown)   SpO2 100%   BMI 25.15 kg/m  Physical Exam Vitals and nursing note reviewed.  Constitutional:      General: She is not in acute distress.    Appearance: She is well-developed. She is not ill-appearing, toxic-appearing or diaphoretic.  HENT:     Head: Normocephalic. Laceration present. No Battle's sign, abrasion, contusion or masses.  Jaw: There is normal jaw occlusion.      Comments: Matted blood to posterior scalp. 3mm laceration to lateal right eye, no lid involvement. Mild right periorbital echymosis    Ears:     Comments: No hemotympanum bilaterally    Nose: Nose normal.     Comments: Nontender nasal bridge, no septal hematoma    Mouth/Throat:     Lips: Pink.     Mouth: Mucous membranes are moist.     Pharynx: Oropharynx is clear. Uvula midline.     Comments: No loose dentition Eyes:     General: No visual field deficit or scleral icterus.    Extraocular Movements: Extraocular movements intact.     Conjunctiva/sclera: Conjunctivae normal.     Pupils: Pupils are equal, round, and reactive to light.     Visual Fields: Right eye visual fields normal and left  eye visual fields normal.     Comments: No traumatic hyphema, PERRLA  Neck:     Trachea: Trachea and phonation normal.     Comments: No midline cervical tenderness, full range of motion without difficulty Cardiovascular:     Rate and Rhythm: Normal rate and regular rhythm.     Pulses:          Radial pulses are 2+ on the right side and 2+ on the left side.       Dorsalis pedis pulses are 3+ on the right side and 2+ on the left side.     Heart sounds: Normal heart sounds.  Pulmonary:     Effort: Pulmonary effort is normal. No respiratory distress.     Breath sounds: Normal breath sounds and air entry.     Comments: Clear bilaterally, speaks in full sentences without difficulty Chest:     Comments: Nontender chest wall, no crepitus or step-off Abdominal:     General: Bowel sounds are normal. There is no distension.     Palpations: Abdomen is soft.     Tenderness: There is no abdominal tenderness.     Comments: Soft, nontender  Musculoskeletal:        General: Normal range of motion.     Cervical back: Full passive range of motion without pain, normal range of motion and neck supple.     Comments: No midline C/T/L tenderness.  No bony tenderness bowel upper and lower extremities.  Moves all 4 extremities at difficulty.  Lifts bilateral legs off bed. No shortening or rotation of legs  Skin:    General: Skin is warm and dry.     Capillary Refill: Capillary refill takes less than 2 seconds.     Findings: Laceration present.     Comments: 3mm laceration to right lateral eye. No active bleeding  Neurological:     General: No focal deficit present.     Mental Status: She is alert and oriented to person, place, and time.     Sensory: Sensation is intact.     Motor: Motor function is intact.     Gait: Gait is intact.     Comments: CN 2-12 grossly intact Ambulatory Equal grip Intact sensation    ED Results / Procedures / Treatments   Labs (all labs ordered are listed, but only  abnormal results are displayed) Labs Reviewed - No data to display  EKG None  Radiology CT HEAD WO CONTRAST ( )  Result Date: 02/04/2023 CLINICAL DATA:  Facial trauma, blunt; Neck trauma (Age >= 65y) EXAM: CT HEAD WITHOUT CONTRAST CT MAXILLOFACIAL WITHOUT CONTRAST CT  CERVICAL SPINE WITHOUT CONTRAST TECHNIQUE: Multidetector CT imaging of the head, cervical spine, and maxillofacial structures were performed using the standard protocol without intravenous contrast. Multiplanar CT image reconstructions of the cervical spine and maxillofacial structures were also generated. RADIATION DOSE REDUCTION: This exam was performed according to the departmental dose-optimization program which includes automated exposure control, adjustment of the mA and/or kV according to patient size and/or use of iterative reconstruction technique. COMPARISON:  None Available. FINDINGS: CT HEAD FINDINGS Brain: Hemorrhage. No hydrocephalus. No extra-axial fluid collection. Chronic infarcts in the bilateral basal ganglia. No mass effect. No mass lesion. Background of moderate chronic microvascular ischemic change Vascular: No hyperdense vessel or unexpected calcification. Skull: Normal. Negative for fracture or focal lesion. Other: None. CT MAXILLOFACIAL FINDINGS Osseous: No fracture or mandibular dislocation. No destructive process. Orbits: Negative. No traumatic or inflammatory finding. Sinuses: No middle ear or mastoid effusion. Mucosal thickening right maxillary sinus. Orbits are unremarkable. Soft tissues: Mild soft tissue swelling in the periorbital soft tissues the right. CT CERVICAL SPINE FINDINGS Alignment: Normal. Skull base and vertebrae: No acute fracture. No primary bone lesion or focal pathologic process. Soft tissues and spinal canal: No prevertebral fluid or swelling. No visible canal hematoma. Disc levels: Central disc protrusion C4-C5 results in moderate spinal canal narrowing with deformation ventral spinal cord  Upper chest: Negative. Other: None IMPRESSION: 1. No acute intracranial abnormality. 2. No acute facial bone fracture. 3. No acute fracture or traumatic listhesis of the cervical spine. 4. Central disc protrusion at C4-C5 results in moderate spinal canal narrowing Electronically Signed   By: Lorenza Cambridge M.D.   On: 02/04/2023 17:57   CT Cervical Spine Wo Contrast  Result Date: 02/04/2023 CLINICAL DATA:  Facial trauma, blunt; Neck trauma (Age >= 65y) EXAM: CT HEAD WITHOUT CONTRAST CT MAXILLOFACIAL WITHOUT CONTRAST CT CERVICAL SPINE WITHOUT CONTRAST TECHNIQUE: Multidetector CT imaging of the head, cervical spine, and maxillofacial structures were performed using the standard protocol without intravenous contrast. Multiplanar CT image reconstructions of the cervical spine and maxillofacial structures were also generated. RADIATION DOSE REDUCTION: This exam was performed according to the departmental dose-optimization program which includes automated exposure control, adjustment of the mA and/or kV according to patient size and/or use of iterative reconstruction technique. COMPARISON:  None Available. FINDINGS: CT HEAD FINDINGS Brain: Hemorrhage. No hydrocephalus. No extra-axial fluid collection. Chronic infarcts in the bilateral basal ganglia. No mass effect. No mass lesion. Background of moderate chronic microvascular ischemic change Vascular: No hyperdense vessel or unexpected calcification. Skull: Normal. Negative for fracture or focal lesion. Other: None. CT MAXILLOFACIAL FINDINGS Osseous: No fracture or mandibular dislocation. No destructive process. Orbits: Negative. No traumatic or inflammatory finding. Sinuses: No middle ear or mastoid effusion. Mucosal thickening right maxillary sinus. Orbits are unremarkable. Soft tissues: Mild soft tissue swelling in the periorbital soft tissues the right. CT CERVICAL SPINE FINDINGS Alignment: Normal. Skull base and vertebrae: No acute fracture. No primary bone lesion  or focal pathologic process. Soft tissues and spinal canal: No prevertebral fluid or swelling. No visible canal hematoma. Disc levels: Central disc protrusion C4-C5 results in moderate spinal canal narrowing with deformation ventral spinal cord Upper chest: Negative. Other: None IMPRESSION: 1. No acute intracranial abnormality. 2. No acute facial bone fracture. 3. No acute fracture or traumatic listhesis of the cervical spine. 4. Central disc protrusion at C4-C5 results in moderate spinal canal narrowing Electronically Signed   By: Lorenza Cambridge M.D.   On: 02/04/2023 17:57   CT Maxillofacial Wo Contrast  Result  Date: 02/04/2023 CLINICAL DATA:  Facial trauma, blunt; Neck trauma (Age >= 65y) EXAM: CT HEAD WITHOUT CONTRAST CT MAXILLOFACIAL WITHOUT CONTRAST CT CERVICAL SPINE WITHOUT CONTRAST TECHNIQUE: Multidetector CT imaging of the head, cervical spine, and maxillofacial structures were performed using the standard protocol without intravenous contrast. Multiplanar CT image reconstructions of the cervical spine and maxillofacial structures were also generated. RADIATION DOSE REDUCTION: This exam was performed according to the departmental dose-optimization program which includes automated exposure control, adjustment of the mA and/or kV according to patient size and/or use of iterative reconstruction technique. COMPARISON:  None Available. FINDINGS: CT HEAD FINDINGS Brain: Hemorrhage. No hydrocephalus. No extra-axial fluid collection. Chronic infarcts in the bilateral basal ganglia. No mass effect. No mass lesion. Background of moderate chronic microvascular ischemic change Vascular: No hyperdense vessel or unexpected calcification. Skull: Normal. Negative for fracture or focal lesion. Other: None. CT MAXILLOFACIAL FINDINGS Osseous: No fracture or mandibular dislocation. No destructive process. Orbits: Negative. No traumatic or inflammatory finding. Sinuses: No middle ear or mastoid effusion. Mucosal thickening  right maxillary sinus. Orbits are unremarkable. Soft tissues: Mild soft tissue swelling in the periorbital soft tissues the right. CT CERVICAL SPINE FINDINGS Alignment: Normal. Skull base and vertebrae: No acute fracture. No primary bone lesion or focal pathologic process. Soft tissues and spinal canal: No prevertebral fluid or swelling. No visible canal hematoma. Disc levels: Central disc protrusion C4-C5 results in moderate spinal canal narrowing with deformation ventral spinal cord Upper chest: Negative. Other: None IMPRESSION: 1. No acute intracranial abnormality. 2. No acute facial bone fracture. 3. No acute fracture or traumatic listhesis of the cervical spine. 4. Central disc protrusion at C4-C5 results in moderate spinal canal narrowing Electronically Signed   By: Lorenza Cambridge M.D.   On: 02/04/2023 17:57    Procedures .Marland KitchenLaceration Repair  Date/Time: 02/04/2023 6:12 PM  Performed by: Ralph Leyden A, PA-C Authorized by: Linwood Dibbles, PA-C   Consent:    Consent obtained:  Verbal   Consent given by:  Patient   Risks, benefits, and alternatives were discussed: yes     Risks discussed:  Infection, poor cosmetic result, tendon damage, retained foreign body, pain, need for additional repair, nerve damage, vascular damage and poor wound healing   Alternatives discussed:  Referral, delayed treatment, no treatment and observation Universal protocol:    Procedure explained and questions answered to patient or proxy's satisfaction: yes     Relevant documents present and verified: yes     Test results available: yes     Imaging studies available: yes     Required blood products, implants, devices, and special equipment available: yes     Site/side marked: yes     Immediately prior to procedure, a time out was called: yes     Patient identity confirmed:  Verbally with patient Anesthesia:    Anesthesia method:  None Laceration details:    Location:  Face   Face location:  R eyebrow    Length (cm):  0.3   Depth (mm):  2 Pre-procedure details:    Preparation:  Patient was prepped and draped in usual sterile fashion and imaging obtained to evaluate for foreign bodies Exploration:    Hemostasis achieved with:  Direct pressure   Imaging obtained comment:  CT   Imaging outcome: foreign body not noted     Wound exploration: wound explored through full range of motion and entire depth of wound visualized     Wound extent: no foreign body, no signs of injury, no  nerve damage, no tendon damage, no underlying fracture and no vascular damage     Contaminated: no   Treatment:    Area cleansed with:  Povidone-iodine   Amount of cleaning:  Extensive   Irrigation solution:  Sterile saline   Irrigation method:  Pressure wash   Scar revision: no   Skin repair:    Repair method:  Tissue adhesive Approximation:    Approximation:  Close Repair type:    Repair type:  Simple Post-procedure details:    Dressing:  Open (no dressing)   Procedure completion:  Tolerated well, no immediate complications     Medications Ordered in ED Medications  Tdap (BOOSTRIX) injection 0.5 mL (0.5 mLs Intramuscular Given 02/04/23 1716)   ED Course/ Medical Decision Making/ A&P   82 year old here for evaluation mechanical fall PTA. She has nonfocal neuroexam without deficits.  Right facial laceration, matted blood to back of head.  Ambulatory PTA.  Will update tetanus given her laceration.  She was off her extremities without difficulty.  She has no bony tenderness.  No active bleeding on exam.  Plan on imaging close her laceration  Laceration closed with Dermabond  Imaging personally viewed and interpreted:  CT head, max face without significant abnormality CT cervical with disc retrusion C4/5 with spinal canal narrowing.  She denies any pain, numbness or weakness.  I encouraged her to follow-up outpatient with PCP, neurosurgery if she develops any pain, numbness or weakness.  Patient reassessed.   Ambulatory.  Long discussion with patient about imaging.  Will have her follow-up outpatient, return for new or worsening symptoms.  The patient has been appropriately medically screened and/or stabilized in the ED. I have low suspicion for any other emergent medical condition which would require further screening, evaluation or treatment in the ED or require inpatient management.  Patient is hemodynamically stable and in no acute distress.  Patient able to ambulate in department prior to ED.  Evaluation does not show acute pathology that would require ongoing or additional emergent interventions while in the emergency department or further inpatient treatment.  I have discussed the diagnosis with the patient and answered all questions.  Pain is been managed while in the emergency department and patient has no further complaints prior to discharge.  Patient is comfortable with plan discussed in room and is stable for discharge at this time.  I have discussed strict return precautions for returning to the emergency department.  Patient was encouraged to follow-up with PCP/specialist refer to at discharge.                                     Medical Decision Making Amount and/or Complexity of Data Reviewed Independent Historian: EMS External Data Reviewed: labs and notes. Radiology: ordered and independent interpretation performed. Decision-making details documented in ED Course.  Risk OTC drugs. Prescription drug management. Decision regarding hospitalization. Diagnosis or treatment significantly limited by social determinants of health.          Final Clinical Impression(s) / ED Diagnoses Final diagnoses:  Fall, initial encounter  Facial laceration, initial encounter  Herniated disc, cervical    Rx / DC Orders ED Discharge Orders     None         Almando Brawley A, PA-C 02/04/23 1813    Jacalyn Lefevre, MD 02/05/23 0015

## 2023-02-04 NOTE — ED Triage Notes (Signed)
Mechanical fall outside [employer] by [employer]. Pt tripped while walking over the curb. Uses a cane to ambulate. Hit her head against concrete ground. No LOC. Not on blood thinners. Alert and oriented x 4.   EMS VS:  180/64 HR 76 CBG 189

## 2023-02-04 NOTE — Discharge Instructions (Addendum)
It was a pleasure taking care of you in the Emergency Department.  You have a small cut to the right of your eye. We have placed Dermabond which is liquid glue to the area.  This will gradually flake away over the next few days. Do not pick at the area. As we discussed in the room your CT scan showed you have a herniated disc in your neck.  You currently have no pain to the area.  Please let your primary care provider know.  If you have any pain please seek reevaluation.  Return for any worsening symptoms.

## 2023-02-11 DIAGNOSIS — E1136 Type 2 diabetes mellitus with diabetic cataract: Secondary | ICD-10-CM | POA: Diagnosis not present

## 2023-02-11 DIAGNOSIS — E559 Vitamin D deficiency, unspecified: Secondary | ICD-10-CM | POA: Diagnosis not present

## 2023-02-11 DIAGNOSIS — E782 Mixed hyperlipidemia: Secondary | ICD-10-CM | POA: Diagnosis not present

## 2023-02-13 DIAGNOSIS — I10 Essential (primary) hypertension: Secondary | ICD-10-CM | POA: Diagnosis not present

## 2023-02-13 DIAGNOSIS — E1136 Type 2 diabetes mellitus with diabetic cataract: Secondary | ICD-10-CM | POA: Diagnosis not present

## 2023-02-13 DIAGNOSIS — R634 Abnormal weight loss: Secondary | ICD-10-CM | POA: Diagnosis not present

## 2023-02-13 DIAGNOSIS — M8580 Other specified disorders of bone density and structure, unspecified site: Secondary | ICD-10-CM | POA: Diagnosis not present

## 2023-02-13 DIAGNOSIS — F419 Anxiety disorder, unspecified: Secondary | ICD-10-CM | POA: Diagnosis not present

## 2023-02-13 DIAGNOSIS — E782 Mixed hyperlipidemia: Secondary | ICD-10-CM | POA: Diagnosis not present

## 2023-02-13 DIAGNOSIS — E1165 Type 2 diabetes mellitus with hyperglycemia: Secondary | ICD-10-CM | POA: Diagnosis not present

## 2023-02-13 DIAGNOSIS — F3342 Major depressive disorder, recurrent, in full remission: Secondary | ICD-10-CM | POA: Diagnosis not present

## 2023-02-13 DIAGNOSIS — Z6825 Body mass index (BMI) 25.0-25.9, adult: Secondary | ICD-10-CM | POA: Diagnosis not present

## 2023-02-25 DIAGNOSIS — Z1331 Encounter for screening for depression: Secondary | ICD-10-CM | POA: Diagnosis not present

## 2023-02-25 DIAGNOSIS — Z9181 History of falling: Secondary | ICD-10-CM | POA: Diagnosis not present

## 2023-02-25 DIAGNOSIS — Z6825 Body mass index (BMI) 25.0-25.9, adult: Secondary | ICD-10-CM | POA: Diagnosis not present

## 2023-02-25 DIAGNOSIS — Z0000 Encounter for general adult medical examination without abnormal findings: Secondary | ICD-10-CM | POA: Diagnosis not present

## 2023-08-07 DIAGNOSIS — E1165 Type 2 diabetes mellitus with hyperglycemia: Secondary | ICD-10-CM | POA: Diagnosis not present

## 2023-08-14 DIAGNOSIS — E559 Vitamin D deficiency, unspecified: Secondary | ICD-10-CM | POA: Diagnosis not present

## 2023-08-14 DIAGNOSIS — Z6825 Body mass index (BMI) 25.0-25.9, adult: Secondary | ICD-10-CM | POA: Diagnosis not present

## 2023-08-14 DIAGNOSIS — I10 Essential (primary) hypertension: Secondary | ICD-10-CM | POA: Diagnosis not present

## 2023-08-14 DIAGNOSIS — M8580 Other specified disorders of bone density and structure, unspecified site: Secondary | ICD-10-CM | POA: Diagnosis not present

## 2023-08-14 DIAGNOSIS — M25551 Pain in right hip: Secondary | ICD-10-CM | POA: Diagnosis not present

## 2023-08-14 DIAGNOSIS — E782 Mixed hyperlipidemia: Secondary | ICD-10-CM | POA: Diagnosis not present

## 2023-08-14 DIAGNOSIS — Z79899 Other long term (current) drug therapy: Secondary | ICD-10-CM | POA: Diagnosis not present

## 2023-08-14 DIAGNOSIS — E1165 Type 2 diabetes mellitus with hyperglycemia: Secondary | ICD-10-CM | POA: Diagnosis not present

## 2023-08-14 DIAGNOSIS — F418 Other specified anxiety disorders: Secondary | ICD-10-CM | POA: Diagnosis not present

## 2023-08-14 DIAGNOSIS — F3342 Major depressive disorder, recurrent, in full remission: Secondary | ICD-10-CM | POA: Diagnosis not present

## 2023-08-14 DIAGNOSIS — K219 Gastro-esophageal reflux disease without esophagitis: Secondary | ICD-10-CM | POA: Diagnosis not present

## 2023-08-19 ENCOUNTER — Emergency Department (HOSPITAL_COMMUNITY)

## 2023-08-19 ENCOUNTER — Other Ambulatory Visit: Payer: Self-pay

## 2023-08-19 ENCOUNTER — Emergency Department (HOSPITAL_COMMUNITY)
Admission: EM | Admit: 2023-08-19 | Discharge: 2023-08-19 | Disposition: A | Discharge status: Home / Self Care | Attending: Emergency Medicine | Admitting: Emergency Medicine

## 2023-08-19 ENCOUNTER — Encounter (HOSPITAL_COMMUNITY): Payer: Self-pay

## 2023-08-19 DIAGNOSIS — I10 Essential (primary) hypertension: Secondary | ICD-10-CM | POA: Diagnosis not present

## 2023-08-19 DIAGNOSIS — W010XXA Fall on same level from slipping, tripping and stumbling without subsequent striking against object, initial encounter: Secondary | ICD-10-CM | POA: Insufficient documentation

## 2023-08-19 DIAGNOSIS — S299XXA Unspecified injury of thorax, initial encounter: Secondary | ICD-10-CM | POA: Diagnosis present

## 2023-08-19 DIAGNOSIS — Z7984 Long term (current) use of oral hypoglycemic drugs: Secondary | ICD-10-CM | POA: Diagnosis not present

## 2023-08-19 DIAGNOSIS — S2231XA Fracture of one rib, right side, initial encounter for closed fracture: Secondary | ICD-10-CM | POA: Diagnosis not present

## 2023-08-19 DIAGNOSIS — Z79899 Other long term (current) drug therapy: Secondary | ICD-10-CM | POA: Diagnosis not present

## 2023-08-19 DIAGNOSIS — Y92009 Unspecified place in unspecified non-institutional (private) residence as the place of occurrence of the external cause: Secondary | ICD-10-CM | POA: Diagnosis not present

## 2023-08-19 DIAGNOSIS — E119 Type 2 diabetes mellitus without complications: Secondary | ICD-10-CM | POA: Insufficient documentation

## 2023-08-19 DIAGNOSIS — W19XXXA Unspecified fall, initial encounter: Secondary | ICD-10-CM

## 2023-08-19 DIAGNOSIS — M25521 Pain in right elbow: Secondary | ICD-10-CM | POA: Diagnosis not present

## 2023-08-19 NOTE — ED Provider Notes (Signed)
 [place] EMERGENCY DEPARTMENT AT [facility] Provider Note   CSN: [id number] Arrival date & time: 08/19/23  1640     History  Chief Complaint  Patient presents with   Allison Eaton is a 83 y.o. female with PMH as listed below who presents with injury to right rib cage, elbow following a fall on her back deck. Pt reports she tripped over a rug on her deck. Denies head trauma or LOC. No pain anywhere except right rib cage below her breast. Otherwise in her NSOH.    Past Medical History:  Diagnosis Date   Diabetes mellitus without complication (HCC)    Hyperlipidemia    Hypertension    Osteopenia        Home Medications Prior to Admission medications   Medication Sig Start Date End Date Taking? Authorizing Provider  amLODipine (NORVASC) 5 MG tablet Take 5 mg by mouth daily.    [provider]  benazepril (LOTENSIN) 20 MG tablet Take 20 mg by mouth daily.    [provider]  calcium carbonate (OS-CAL) 600 MG TABS tablet Take 600 mg by mouth 2 (two) times daily with a meal.    [provider]  Cholecalciferol (VITAMIN D-3) 5000 UNITS TABS Take 5,000 Units by mouth.    [provider]  furosemide (LASIX) 20 MG tablet Take 20 mg by mouth.    [provider]  glimepiride (AMARYL) 4 MG tablet Take 4 mg by mouth daily with breakfast.    [provider]  Glucosamine-Chondroitin (OSTEO BI-FLEX REGULAR STRENGTH PO) Take by mouth daily.    [provider]  magnesium oxide (MAG-OX) 400 MG tablet Take 400 mg by mouth daily.    [provider]  metFORMIN (GLUCOPHAGE) 500 MG tablet Take 500 mg by mouth.    [provider]  potassium chloride  SA (K-DUR) 20 MEQ tablet Take 1 tablet (20 mEq total) by mouth once for 1 dose. 12/11/18 12/11/18  Kelsey Patricia, MD  rosuvastatin (CRESTOR) 10 MG tablet Take 10 mg by mouth daily.    [provider]  vitamin E 400 UNIT capsule Take 400 Units  by mouth daily.    [provider]  Zinc 50 MG CAPS Take by mouth.    [provider]      Allergies    Clindamycin/lincomycin, Demerol [meperidine], Elemental sulfur, Food, Morphine and codeine, and Penicillins    Review of Systems   Review of Systems A 10 point review of systems was performed and is negative unless otherwise reported in HPI.  Physical Exam Updated Vital Signs BP (!) 145/61   Pulse 71   Temp 97.7 F (36.5 C) (Oral)   Resp 19   Ht 5\' 3"  (1.6 m)   Wt 64.4 kg   LMP  (LMP Unknown)   SpO2 97%   BMI 25.15 kg/m  Physical Exam General: Normal appearing female, lying in bed.  HEENT: PERRLA, Sclera anicteric, MMM, trachea midline.  Cardiology: RRR, no murmurs/rubs/gallops. TTP to R ribs inferior to R breast, without any crepitus or deformity.  Resp: Normal respiratory rate and effort. CTAB, no wheezes, rhonchi, crackles.  Abd: Soft, non-tender, non-distended. No rebound tenderness or guarding.  Pelvis: Pelvis stable/nontender MSK: No TTP to R elbow. No peripheral edema or signs of trauma. Extremities without deformity or TTP. No cyanosis or clubbing. Skin: warm, dry. No rashes or lesions. Back: No CVA tenderness. No midline C T or L spine TTP deformities or  stepoffs.  Neuro: A&Ox4, CNs II-XII grossly intact. MAEs. Sensation grossly intact.  Psych: Normal mood and affect.   ED Results / Procedures / Treatments   Labs (all labs ordered are listed, but only abnormal results are displayed) Labs Reviewed - No data to display  EKG None  Radiology DG Elbow Complete Right Result Date: 08/19/2023 CLINICAL DATA:  Fall, pain EXAM: RIGHT ELBOW - COMPLETE 3+ VIEW COMPARISON:  None Available. FINDINGS: There is no evidence of fracture, dislocation, or joint effusion. There is no evidence of arthropathy or other focal bone abnormality. Soft tissues are unremarkable. IMPRESSION: Negative. Electronically Signed   By: Janeece Mechanic M.D.   On: 08/19/2023 17:23    DG Ribs Unilateral W/Chest Right Result Date: 08/19/2023 CLINICAL DATA:  Fall, right rib pain EXAM: RIGHT RIBS AND CHEST - 3+ VIEW COMPARISON:  12/08/2018 FINDINGS: Fracture through the lateral right 5th rib. No effusion or pneumothorax. Heart is normal size. Lungs clear. IMPRESSION: Right lateral 5th rib fracture.  No effusion or pneumothorax. Electronically Signed   By: Janeece Mechanic M.D.   On: 08/19/2023 17:23    Procedures Procedures    Medications Ordered in ED Medications - No data to display  ED Course/ Medical Decision Making/ A&P                          Medical Decision Making Amount and/or Complexity of Data Reviewed Radiology: ordered. Decision-making details documented in ED Course.    MDM:    Patient with R 9th lateral rib fracture, no PTX or pleural effusion. No hypoxia, resp distress, SOB. Offered CT scan for further workup and declines. Given incentive spirometry, instructed to use it q1h while awake. Instructed to take tylenol  for pain. Instructed to f/u with PCP within 1 week.   Clinical Course as of 08/19/23 1940  Tue Aug 19, 2023  1735 DG Elbow Complete Right Negative [HN]  1735 DG Ribs Unilateral W/Chest Right Right lateral 5th rib fracture.  No effusion or pneumothorax. [HN]    Clinical Course User Index [HN] Merdis Stalling, MD   Imaging Studies ordered: R ribs XR, R elbow XR ordered from triage I independently visualized and interpreted imaging. I agree with the radiologist interpretation  Additional history obtained from chart review  Social Determinants of Health: Lives independently  Disposition:  DC w/ discharge instructions/return precautions. All questions answered to patient's satisfaction.    Co morbidities that complicate the patient evaluation  Past Medical History:  Diagnosis Date   Diabetes mellitus without complication (HCC)    Hyperlipidemia    Hypertension    Osteopenia      Medicines No orders of the defined types  were placed in this encounter.   I have reviewed the patients home medicines and have made adjustments as needed  Problem List / ED Course: Problem List Items Addressed This Visit   None Visit Diagnoses       Fall in home, initial encounter    -  Primary     Closed fracture of one rib of right side, initial encounter                       This note was created using dictation software, which may contain spelling or grammatical errors.    Merdis Stalling, MD 08/19/23 [phone]

## 2023-08-19 NOTE — Discharge Instructions (Signed)
 Thank you for coming to [facility] Emergency Department. You were seen for fall and right rib pain. We did an exam, and imaging, and these showed one rib fracture of Rib #9 on the right side. Please use the incentive spirometer every hour while awake. You can continue taking tylenol  for pain. Please follow up with your primary care provider within 1 week.   Do not hesitate to return to the ED or call 911 if you experience: -Worsening symptoms -Shortness of breath -Lightheadedness, passing out -Fevers/chills -Anything else that concerns you

## 2023-08-19 NOTE — ED Triage Notes (Signed)
 Pt arrived via POV from home c/o injury to right rib cage, elbow following a fall on her back deck. Pt reports she tripped over a rug on her deck. Pt denies hitting her head denies LOC.

## 2023-09-30 DIAGNOSIS — E119 Type 2 diabetes mellitus without complications: Secondary | ICD-10-CM | POA: Diagnosis not present

## 2023-09-30 DIAGNOSIS — H25813 Combined forms of age-related cataract, bilateral: Secondary | ICD-10-CM | POA: Diagnosis not present

## 2024-01-02 ENCOUNTER — Other Ambulatory Visit: Payer: Self-pay | Admitting: Family Medicine

## 2024-01-02 DIAGNOSIS — Z1231 Encounter for screening mammogram for malignant neoplasm of breast: Secondary | ICD-10-CM

## 2024-01-13 DIAGNOSIS — H25043 Posterior subcapsular polar age-related cataract, bilateral: Secondary | ICD-10-CM | POA: Diagnosis not present

## 2024-01-13 DIAGNOSIS — H18413 Arcus senilis, bilateral: Secondary | ICD-10-CM | POA: Diagnosis not present

## 2024-01-13 DIAGNOSIS — H2513 Age-related nuclear cataract, bilateral: Secondary | ICD-10-CM | POA: Diagnosis not present

## 2024-01-13 DIAGNOSIS — H2512 Age-related nuclear cataract, left eye: Secondary | ICD-10-CM | POA: Diagnosis not present

## 2024-01-13 DIAGNOSIS — H25013 Cortical age-related cataract, bilateral: Secondary | ICD-10-CM | POA: Diagnosis not present

## 2024-01-27 ENCOUNTER — Ambulatory Visit
Admission: RE | Admit: 2024-01-27 | Discharge: 2024-01-27 | Disposition: A | Discharge status: Home / Self Care | Source: Ambulatory Visit | Attending: Family Medicine | Admitting: Family Medicine

## 2024-01-27 DIAGNOSIS — Z1231 Encounter for screening mammogram for malignant neoplasm of breast: Secondary | ICD-10-CM | POA: Diagnosis not present

## 2024-03-02 DIAGNOSIS — K219 Gastro-esophageal reflux disease without esophagitis: Secondary | ICD-10-CM | POA: Diagnosis not present

## 2024-03-02 DIAGNOSIS — E1165 Type 2 diabetes mellitus with hyperglycemia: Secondary | ICD-10-CM | POA: Diagnosis not present

## 2024-03-02 DIAGNOSIS — E559 Vitamin D deficiency, unspecified: Secondary | ICD-10-CM | POA: Diagnosis not present

## 2024-03-02 DIAGNOSIS — F3342 Major depressive disorder, recurrent, in full remission: Secondary | ICD-10-CM | POA: Diagnosis not present

## 2024-03-02 DIAGNOSIS — Z79899 Other long term (current) drug therapy: Secondary | ICD-10-CM | POA: Diagnosis not present

## 2024-03-02 DIAGNOSIS — E782 Mixed hyperlipidemia: Secondary | ICD-10-CM | POA: Diagnosis not present

## 2024-03-02 DIAGNOSIS — M8580 Other specified disorders of bone density and structure, unspecified site: Secondary | ICD-10-CM | POA: Diagnosis not present

## 2024-03-02 DIAGNOSIS — Z0000 Encounter for general adult medical examination without abnormal findings: Secondary | ICD-10-CM | POA: Diagnosis not present

## 2024-03-02 DIAGNOSIS — F418 Other specified anxiety disorders: Secondary | ICD-10-CM | POA: Diagnosis not present

## 2024-03-02 DIAGNOSIS — N3941 Urge incontinence: Secondary | ICD-10-CM | POA: Diagnosis not present

## 2024-03-02 DIAGNOSIS — Z9181 History of falling: Secondary | ICD-10-CM | POA: Diagnosis not present

## 2024-03-02 DIAGNOSIS — I10 Essential (primary) hypertension: Secondary | ICD-10-CM | POA: Diagnosis not present

## 2024-03-22 DIAGNOSIS — H2512 Age-related nuclear cataract, left eye: Secondary | ICD-10-CM | POA: Diagnosis not present

## 2024-03-22 DIAGNOSIS — H5371 Glare sensitivity: Secondary | ICD-10-CM | POA: Diagnosis not present

## 2024-03-23 DIAGNOSIS — H2511 Age-related nuclear cataract, right eye: Secondary | ICD-10-CM | POA: Diagnosis not present

## 2024-04-01 ENCOUNTER — Encounter: Payer: Self-pay | Admitting: *Deleted

## 2024-04-01 NOTE — Progress Notes (Signed)
 Allison Eaton                                          MRN: [id number]   04/01/2024   The [facility] Quality Team Specialist reviewed this patient medical record for the purposes of chart review for care gap closure. The following were reviewed: chart review for care gap closure-kidney health evaluation for diabetes:eGFR  and uACR.    [facility] Quality Team
